# Patient Record
Sex: Female | Born: 1975 | Race: Black or African American | Hispanic: No | Smoking: Never smoker
Health system: Southern US, Community
[De-identification: ages and names within clinical notes are randomized; demographics above are authoritative.]

## PROBLEM LIST (undated history)

## (undated) DIAGNOSIS — E039 Hypothyroidism, unspecified: Secondary | ICD-10-CM

## (undated) DIAGNOSIS — F419 Anxiety disorder, unspecified: Secondary | ICD-10-CM

## (undated) DIAGNOSIS — F32A Depression, unspecified: Secondary | ICD-10-CM

## (undated) DIAGNOSIS — I10 Essential (primary) hypertension: Secondary | ICD-10-CM

## (undated) DIAGNOSIS — K219 Gastro-esophageal reflux disease without esophagitis: Secondary | ICD-10-CM

## (undated) DIAGNOSIS — N809 Endometriosis, unspecified: Secondary | ICD-10-CM

## (undated) DIAGNOSIS — Z973 Presence of spectacles and contact lenses: Secondary | ICD-10-CM

## (undated) DIAGNOSIS — F329 Major depressive disorder, single episode, unspecified: Secondary | ICD-10-CM

## (undated) DIAGNOSIS — Z8489 Family history of other specified conditions: Secondary | ICD-10-CM

## (undated) DIAGNOSIS — J45909 Unspecified asthma, uncomplicated: Secondary | ICD-10-CM

## (undated) DIAGNOSIS — R0602 Shortness of breath: Secondary | ICD-10-CM

## (undated) DIAGNOSIS — R51 Headache: Secondary | ICD-10-CM

## (undated) DIAGNOSIS — D649 Anemia, unspecified: Secondary | ICD-10-CM

## (undated) HISTORY — PX: DIAGNOSTIC LAPAROSCOPY: SUR761

## (undated) HISTORY — DX: Endometriosis, unspecified: N80.9

## (undated) HISTORY — DX: Morbid (severe) obesity due to excess calories: E66.01

---

## 2006-10-10 HISTORY — PX: DIAGNOSTIC LAPAROSCOPY: SUR761

## 2013-05-01 ENCOUNTER — Other Ambulatory Visit: Payer: Self-pay | Admitting: Obstetrics and Gynecology

## 2013-05-01 ENCOUNTER — Encounter (HOSPITAL_COMMUNITY): Payer: Self-pay | Admitting: Obstetrics and Gynecology

## 2013-05-02 ENCOUNTER — Encounter (HOSPITAL_COMMUNITY): Payer: Self-pay

## 2013-05-03 ENCOUNTER — Ambulatory Visit (HOSPITAL_COMMUNITY): Payer: BC Managed Care – PPO | Admitting: Anesthesiology

## 2013-05-03 ENCOUNTER — Encounter (HOSPITAL_COMMUNITY)
Admission: RE | Disposition: A | Discharge status: Home / Self Care | Payer: Self-pay | Source: Ambulatory Visit | Attending: Obstetrics and Gynecology

## 2013-05-03 ENCOUNTER — Encounter (HOSPITAL_COMMUNITY): Payer: Self-pay | Admitting: Anesthesiology

## 2013-05-03 ENCOUNTER — Ambulatory Visit (HOSPITAL_COMMUNITY)
Admission: RE | Admit: 2013-05-03 | Discharge: 2013-05-03 | Disposition: A | Discharge status: Home / Self Care | Payer: BC Managed Care – PPO | Source: Ambulatory Visit | Attending: Obstetrics and Gynecology | Admitting: Obstetrics and Gynecology

## 2013-05-03 DIAGNOSIS — N938 Other specified abnormal uterine and vaginal bleeding: Secondary | ICD-10-CM | POA: Insufficient documentation

## 2013-05-03 DIAGNOSIS — I10 Essential (primary) hypertension: Secondary | ICD-10-CM | POA: Insufficient documentation

## 2013-05-03 DIAGNOSIS — N949 Unspecified condition associated with female genital organs and menstrual cycle: Secondary | ICD-10-CM | POA: Insufficient documentation

## 2013-05-03 DIAGNOSIS — N840 Polyp of corpus uteri: Secondary | ICD-10-CM | POA: Insufficient documentation

## 2013-05-03 HISTORY — DX: Essential (primary) hypertension: I10

## 2013-05-03 HISTORY — DX: Hypothyroidism, unspecified: E03.9

## 2013-05-03 HISTORY — DX: Anxiety disorder, unspecified: F41.9

## 2013-05-03 HISTORY — DX: Depression, unspecified: F32.A

## 2013-05-03 HISTORY — DX: Shortness of breath: R06.02

## 2013-05-03 HISTORY — DX: Headache: R51

## 2013-05-03 HISTORY — DX: Major depressive disorder, single episode, unspecified: F32.9

## 2013-05-03 HISTORY — DX: Unspecified asthma, uncomplicated: J45.909

## 2013-05-03 HISTORY — DX: Gastro-esophageal reflux disease without esophagitis: K21.9

## 2013-05-03 HISTORY — PX: DILATATION & CURRETTAGE/HYSTEROSCOPY WITH RESECTOCOPE: SHX5572

## 2013-05-03 LAB — CBC
HCT: 33.6 % — ABNORMAL LOW (ref 36.0–46.0)
Hemoglobin: 11.1 g/dL — ABNORMAL LOW (ref 12.0–15.0)
MCH: 25.5 pg — ABNORMAL LOW (ref 26.0–34.0)
MCHC: 33 g/dL (ref 30.0–36.0)
MCV: 77.1 fL — ABNORMAL LOW (ref 78.0–100.0)

## 2013-05-03 SURGERY — DILATATION & CURETTAGE/HYSTEROSCOPY WITH RESECTOCOPE
Anesthesia: General | Site: Uterus | Wound class: Clean Contaminated

## 2013-05-03 MED ORDER — GLYCOPYRROLATE 0.2 MG/ML IJ SOLN
INTRAMUSCULAR | Status: DC | PRN
  Administered 2013-05-03: 0.2 mg via INTRAVENOUS

## 2013-05-03 MED ORDER — DEXAMETHASONE SODIUM PHOSPHATE 10 MG/ML IJ SOLN
INTRAMUSCULAR | Status: DC | PRN
  Administered 2013-05-03: 10 mg via INTRAVENOUS

## 2013-05-03 MED ORDER — IBUPROFEN 800 MG PO TABS: 800.0000 mg | ORAL_TABLET | Freq: Three times a day (TID) | ORAL | Status: DC | PRN

## 2013-05-03 MED ORDER — KETOROLAC TROMETHAMINE 30 MG/ML IJ SOLN
INTRAMUSCULAR | Status: DC | PRN
  Administered 2013-05-03: 30 mg via INTRAVENOUS
  Administered 2013-05-03: 30 mg via INTRAMUSCULAR

## 2013-05-03 MED ORDER — PROPOFOL 10 MG/ML IV EMUL
INTRAVENOUS | Status: AC
  Filled 2013-05-03: qty 20

## 2013-05-03 MED ORDER — MEPERIDINE HCL 25 MG/ML IJ SOLN: 6.2500 mg | INTRAMUSCULAR | Status: DC | PRN

## 2013-05-03 MED ORDER — ONDANSETRON HCL 4 MG/2ML IJ SOLN
INTRAMUSCULAR | Status: DC | PRN
  Administered 2013-05-03: 4 mg via INTRAVENOUS

## 2013-05-03 MED ORDER — FENTANYL CITRATE 0.05 MG/ML IJ SOLN
INTRAMUSCULAR | Status: AC
  Filled 2013-05-03: qty 2

## 2013-05-03 MED ORDER — ONDANSETRON HCL 4 MG/2ML IJ SOLN: 4.0000 mg | Freq: Once | INTRAMUSCULAR | Status: DC | PRN

## 2013-05-03 MED ORDER — CHLOROPROCAINE HCL 1 % IJ SOLN
INTRAMUSCULAR | Status: DC | PRN
  Administered 2013-05-03: 18 mL

## 2013-05-03 MED ORDER — MIDAZOLAM HCL 2 MG/2ML IJ SOLN
INTRAMUSCULAR | Status: AC
  Filled 2013-05-03: qty 2

## 2013-05-03 MED ORDER — LIDOCAINE HCL (CARDIAC) 20 MG/ML IV SOLN
INTRAVENOUS | Status: AC
  Filled 2013-05-03: qty 5

## 2013-05-03 MED ORDER — ONDANSETRON HCL 4 MG/2ML IJ SOLN
INTRAMUSCULAR | Status: AC
  Filled 2013-05-03: qty 2

## 2013-05-03 MED ORDER — GLYCINE 1.5 % IR SOLN
Status: DC | PRN
  Administered 2013-05-03: 3000 mL

## 2013-05-03 MED ORDER — PROPOFOL 10 MG/ML IV BOLUS
INTRAVENOUS | Status: DC | PRN
  Administered 2013-05-03: 250 mg via INTRAVENOUS

## 2013-05-03 MED ORDER — FENTANYL CITRATE 0.05 MG/ML IJ SOLN: 25.0000 ug | INTRAMUSCULAR | Status: DC | PRN

## 2013-05-03 MED ORDER — KETOROLAC TROMETHAMINE 30 MG/ML IJ SOLN: 15.0000 mg | Freq: Once | INTRAMUSCULAR | Status: DC | PRN

## 2013-05-03 MED ORDER — KETOROLAC TROMETHAMINE 30 MG/ML IJ SOLN
INTRAMUSCULAR | Status: AC
  Filled 2013-05-03: qty 1

## 2013-05-03 MED ORDER — KETOROLAC TROMETHAMINE 30 MG/ML IJ SOLN
INTRAMUSCULAR | Status: AC
  Filled 2013-05-03: qty 2

## 2013-05-03 MED ORDER — LIDOCAINE HCL (CARDIAC) 20 MG/ML IV SOLN
INTRAVENOUS | Status: DC | PRN
  Administered 2013-05-03: 10 mg via INTRAVENOUS

## 2013-05-03 MED ORDER — CHLOROPROCAINE HCL 1 % IJ SOLN
INTRAMUSCULAR | Status: AC
  Filled 2013-05-03: qty 30

## 2013-05-03 MED ORDER — MIDAZOLAM HCL 5 MG/5ML IJ SOLN
INTRAMUSCULAR | Status: DC | PRN
  Administered 2013-05-03: 2 mg via INTRAVENOUS

## 2013-05-03 MED ORDER — LACTATED RINGERS IV SOLN
INTRAVENOUS | Status: DC
  Administered 2013-05-03 (×2): via INTRAVENOUS

## 2013-05-03 MED ORDER — DEXAMETHASONE SODIUM PHOSPHATE 10 MG/ML IJ SOLN
INTRAMUSCULAR | Status: AC
  Filled 2013-05-03: qty 1

## 2013-05-03 MED ORDER — FENTANYL CITRATE 0.05 MG/ML IJ SOLN
INTRAMUSCULAR | Status: DC | PRN
  Administered 2013-05-03: 25 ug via INTRAVENOUS
  Administered 2013-05-03 (×2): 50 ug via INTRAVENOUS
  Administered 2013-05-03: 25 ug via INTRAVENOUS

## 2013-05-03 SURGICAL SUPPLY — 17 items
CANISTER SUCTION 2500CC (MISCELLANEOUS) ×6 IMPLANT
CATH ROBINSON RED A/P 16FR (CATHETERS) ×2 IMPLANT
CLOTH BEACON ORANGE TIMEOUT ST (SAFETY) ×2 IMPLANT
CONTAINER PREFILL 10% NBF 60ML (FORM) ×4 IMPLANT
DRESSING TELFA 8X3 (GAUZE/BANDAGES/DRESSINGS) ×2 IMPLANT
ELECT REM PT RETURN 9FT ADLT (ELECTROSURGICAL) ×2
ELECTRODE REM PT RTRN 9FT ADLT (ELECTROSURGICAL) ×1 IMPLANT
GLOVE BIO SURGEON STRL SZ 6.5 (GLOVE) ×4 IMPLANT
GLOVE BIOGEL PI IND STRL 7.0 (GLOVE) ×6 IMPLANT
GLOVE BIOGEL PI INDICATOR 7.0 (GLOVE) ×6
GLOVE SURG SS PI 7.0 STRL IVOR (GLOVE) ×10 IMPLANT
GOWN STRL REIN XL XLG (GOWN DISPOSABLE) ×6 IMPLANT
LOOP ANGLED CUTTING 22FR (CUTTING LOOP) ×2 IMPLANT
PACK HYSTEROSCOPY LF (CUSTOM PROCEDURE TRAY) ×2 IMPLANT
PAD OB MATERNITY 4.3X12.25 (PERSONAL CARE ITEMS) ×2 IMPLANT
TOWEL OR 17X24 6PK STRL BLUE (TOWEL DISPOSABLE) ×4 IMPLANT
WATER STERILE IRR 1000ML POUR (IV SOLUTION) ×2 IMPLANT

## 2013-05-03 NOTE — H&P (Signed)
  Admit  CC: irreg bleeding  HPI 37 yo G1P1 BF presents for dx hysteroscopy, D&C, endom polyp here for surgical mgmt. Pt had sonohysterogram which showed multiple endom mass   All> NKDA Med: amlodipine Chlorthalidone  Medical: chronic HTN Surgery  FH noncontributory SH: nonsmoker ROS neg  PE: WDWN F in NAD Skin: no lesion HEENT anicteric sclera pink conjunctiva Oropharynx CoR: RRR Lung: clear to A Abd obese, nontender  Pelvic deferred Extr no edema  IMP: DUB endom polyp P) dx hysteroscopy, D&C. Risk of surgery: infection, bleeding, injury to surrounding organ structures, thermal injury

## 2013-05-03 NOTE — Op Note (Signed)
Penny Yu, Penny Yu                  ACCOUNT NO.:  [id number]  MEDICAL RECORD NO.:  [id number]  LOCATION:  [facility]                          FACILITY:  [facility]  PHYSICIAN:  Maxie Better, M.D.DATE OF BIRTH:  1976-06-22  DATE OF PROCEDURE:  05/03/2013 DATE OF DISCHARGE:  05/03/2013                              OPERATIVE REPORT   PREOPERATIVE DIAGNOSES: 1. Dysfunction uterine bleeding. 2. Endometrial mass.  PROCEDURES:  Diagnostic hysteroscopy, dilation and curettage.  POSTOPERATIVE DIAGNOSES: 1. Dysfunction uterine bleeding. 2. Endometrial mass.  ANESTHESIA:  General, paracervical block.  SURGEON:  Maxie Better, MD  ASSISTANT:  None.  DESCRIPTION OF PROCEDURE:  Under adequate general anesthesia, the patient was placed in the dorsal lithotomy position.  She was sterilely prepped and draped in usual fashion.  Bladder was catheterized for moderate amount of urine.  Examination under anesthesia revealed a retroverted uterus, slightly enlarged.  No adnexal masses could be appreciated.  Bivalve speculum was placed in the vagina.  Parous cervix was noted.  Wide ectropion seen.  A 20 mL of 1% Nesacaine was injected paracervically at the 3 and 9 o'clock position.  The anterior lip of the cervix was grasped with a single-tooth tenaculum.  The cervix was easily dilated up to #27 [facility] dilator.  A glycine-primed resectoscope was introduced into the uterine cavity.  Large amount of tissue admixed with blood was noted.  The resectoscope was removed.  The cavity was then sharply curetted.  The resectoscope was inserted.  The right tubal ostia could be seen.  The left was not seen clearly.  There was no evidence of fibroid, although when the cavity has been curetted, the right posterior aspect felt a little bit irregular.  Nonetheless, inspection of the anterior and posterior including the endocervix showed no evidence of any lesions, at which time the resectoscope was removed.  All  instruments were then removed from the vagina.  SPECIMEN:  Labeled endometrial curetting with polyps sent to Pathology.  ESTIMATED BLOOD LOSS:  Minimal.  FLUID DEFICIT:  220 mL.  COMPLICATIONS:  None.  The patient tolerated the procedure well, was transferred to the recovery room in stable condition.     Maxie Better, M.D.     [place]/[facility]  D:  05/03/2013  T:  05/03/2013  Job:  [id number]

## 2013-05-03 NOTE — Anesthesia Procedure Notes (Signed)
Procedure Name: LMA Insertion Date/Time: 05/03/2013 2:18 PM Performed by: Lincoln Brigham Pre-anesthesia Checklist: Patient identified, Patient being monitored, Emergency Drugs available, Suction available and Timeout performed Patient Re-evaluated:Patient Re-evaluated prior to inductionOxygen Delivery Method: Circle system utilized Preoxygenation: Pre-oxygenation with 100% oxygen Intubation Type: IV induction Ventilation: Mask ventilation without difficulty LMA: LMA inserted LMA Size: 4.0 Tube size: 4.0 mm Number of attempts: 1 Placement Confirmation: ETT inserted through vocal cords under direct vision,  positive ETCO2,  CO2 detector and breath sounds checked- equal and bilateral

## 2013-05-03 NOTE — Anesthesia Postprocedure Evaluation (Signed)
Anesthesia Post Note  Patient: Penny Yu  Procedure(s) Performed: Procedure(s) (LRB): DILATATION & CURETTAGE/HYSTEROSCOPY WITH RESECTOCOPE (N/A)  Anesthesia type: General  Patient location: PACU  Post pain: Pain level controlled  Post assessment: Post-op Vital signs reviewed  Last Vitals:  Filed Vitals:   05/03/13 1545  BP: 135/67  Pulse: 62  Temp:   Resp: 19    Post vital signs: Reviewed  Level of consciousness: sedated  Complications: No apparent anesthesia complications

## 2013-05-03 NOTE — Anesthesia Preprocedure Evaluation (Signed)
Anesthesia Evaluation  Patient identified by MRN, date of birth, ID band Patient awake    Reviewed: Allergy & Precautions, H&P , NPO status , Patient's Chart, lab work & pertinent test results  Airway Mallampati: I TM Distance: >3 FB Neck ROM: full    Dental no notable dental hx. (+) Teeth Intact   Pulmonary neg pulmonary ROS,    Pulmonary exam normal       Cardiovascular hypertension, Pt. on medications     Neuro/Psych negative neurological ROS  negative psych ROS   GI/Hepatic negative GI ROS, Neg liver ROS,   Endo/Other  negative endocrine ROS  Renal/GU negative Renal ROS  negative genitourinary   Musculoskeletal negative musculoskeletal ROS (+)   Abdominal Normal abdominal exam  (+)   Peds negative pediatric ROS (+)  Hematology negative hematology ROS (+)   Anesthesia Other Findings   Reproductive/Obstetrics negative OB ROS                           Anesthesia Physical Anesthesia Plan  ASA: II  Anesthesia Plan: General   Post-op Pain Management:    Induction: Intravenous  Airway Management Planned: LMA  Additional Equipment:   Intra-op Plan:   Post-operative Plan:   Informed Consent: I have reviewed the patients History and Physical, chart, labs and discussed the procedure including the risks, benefits and alternatives for the proposed anesthesia with the patient or authorized representative who has indicated his/her understanding and acceptance.     Plan Discussed with: CRNA and Surgeon  Anesthesia Plan Comments:         Anesthesia Quick Evaluation

## 2013-05-03 NOTE — Transfer of Care (Signed)
Immediate Anesthesia Transfer of Care Note  Patient: Penny Yu  Procedure(s) Performed: Procedure(s): DILATATION & CURETTAGE/HYSTEROSCOPY WITH RESECTOCOPE (N/A)  Patient Location: PACU  Anesthesia Type:General  Level of Consciousness: awake, alert  and oriented  Airway & Oxygen Therapy: Patient Spontanous Breathing and Patient connected to nasal cannula oxygen  Post-op Assessment: Report given to PACU RN and Post -op Vital signs reviewed and stable  Post vital signs: Reviewed and stable  Complications: No apparent anesthesia complications

## 2013-05-06 ENCOUNTER — Encounter (HOSPITAL_COMMUNITY): Payer: Self-pay | Admitting: Obstetrics and Gynecology

## 2014-08-01 ENCOUNTER — Emergency Department (HOSPITAL_COMMUNITY): Payer: BC Managed Care – PPO

## 2014-08-01 ENCOUNTER — Emergency Department (HOSPITAL_COMMUNITY)
Admission: EM | Admit: 2014-08-01 | Discharge: 2014-08-01 | Disposition: A | Discharge status: Home / Self Care | Payer: BC Managed Care – PPO | Attending: Emergency Medicine | Admitting: Emergency Medicine

## 2014-08-01 ENCOUNTER — Encounter (HOSPITAL_COMMUNITY): Payer: Self-pay | Admitting: Emergency Medicine

## 2014-08-01 DIAGNOSIS — Z8719 Personal history of other diseases of the digestive system: Secondary | ICD-10-CM | POA: Insufficient documentation

## 2014-08-01 DIAGNOSIS — Z79899 Other long term (current) drug therapy: Secondary | ICD-10-CM | POA: Insufficient documentation

## 2014-08-01 DIAGNOSIS — Z8659 Personal history of other mental and behavioral disorders: Secondary | ICD-10-CM | POA: Insufficient documentation

## 2014-08-01 DIAGNOSIS — Z8639 Personal history of other endocrine, nutritional and metabolic disease: Secondary | ICD-10-CM | POA: Insufficient documentation

## 2014-08-01 DIAGNOSIS — I10 Essential (primary) hypertension: Secondary | ICD-10-CM | POA: Diagnosis not present

## 2014-08-01 DIAGNOSIS — R079 Chest pain, unspecified: Secondary | ICD-10-CM

## 2014-08-01 DIAGNOSIS — J45901 Unspecified asthma with (acute) exacerbation: Secondary | ICD-10-CM | POA: Insufficient documentation

## 2014-08-01 DIAGNOSIS — Z720 Tobacco use: Secondary | ICD-10-CM | POA: Diagnosis not present

## 2014-08-01 LAB — BASIC METABOLIC PANEL
ANION GAP: 12 (ref 5–15)
BUN: 12 mg/dL (ref 6–23)
CHLORIDE: 100 meq/L (ref 96–112)
CO2: 24 meq/L (ref 19–32)
CREATININE: 0.65 mg/dL (ref 0.50–1.10)
Calcium: 8.9 mg/dL (ref 8.4–10.5)
GFR calc non Af Amer: >90 mL/min (ref >90)
Glucose, Bld: 93 mg/dL (ref 70–99)
POTASSIUM: 3.9 meq/L (ref 3.7–5.3)
Sodium: 136 mEq/L — ABNORMAL LOW (ref 137–147)

## 2014-08-01 LAB — CBC
HEMATOCRIT: 36.3 % (ref 36.0–46.0)
Hemoglobin: 11.8 g/dL — ABNORMAL LOW (ref 12.0–15.0)
MCH: 24.8 pg — ABNORMAL LOW (ref 26.0–34.0)
MCHC: 32.5 g/dL (ref 30.0–36.0)
MCV: 76.3 fL — AB (ref 78.0–100.0)
PLATELETS: 375 10*3/uL (ref 150–400)
RBC: 4.76 MIL/uL (ref 3.87–5.11)
RDW: 14.7 % (ref 11.5–15.5)
WBC: 4.4 10*3/uL (ref 4.0–10.5)

## 2014-08-01 LAB — I-STAT TROPONIN, ED: Troponin i, poc: 0 ng/mL (ref 0.00–0.08)

## 2014-08-01 MED ORDER — KETOROLAC TROMETHAMINE 60 MG/2ML IM SOLN
60.0000 mg | Freq: Once | INTRAMUSCULAR | Status: AC
  Administered 2014-08-01: 60 mg via INTRAMUSCULAR
  Filled 2014-08-01: qty 2

## 2014-08-01 MED ORDER — NAPROXEN 500 MG PO TABS: 500.0000 mg | ORAL_TABLET | Freq: Two times a day (BID) | ORAL | Status: DC

## 2014-08-01 NOTE — Discharge Instructions (Signed)

## 2014-08-01 NOTE — ED Provider Notes (Signed)
CSN: [id number]     Arrival date & time 08/01/14  7035 History   First MD Initiated Contact with Patient 08/01/14 [phone]     Chief Complaint  Patient presents with  . Chest Pain     (Consider location/radiation/quality/duration/timing/severity/associated sxs/prior Treatment) Patient is a 38 y.o. female presenting with chest pain. The history is provided by the patient. No language interpreter was used.  Chest Pain Pain location:  Substernal area Pain quality: tightness   Pain radiates to:  Does not radiate Pain severity:  Moderate Onset quality:  Unable to specify Duration:  1 week Timing:  Constant Progression:  Unchanged Chronicity:  New Context: at rest   Relieved by:  Nothing Exacerbated by: Lifting arms above head, coughing, bending forward. Ineffective treatments: Gas-X. Associated symptoms: shortness of breath (on exertion only)   Associated symptoms: no abdominal pain, no back pain, no claudication, no cough, no diaphoresis, no fatigue, no fever, no headache, no lower extremity edema, no nausea, no near-syncope, no numbness, no palpitations, not vomiting and no weakness   Risk factors: hypertension   Risk factors: no aortic disease, no birth control, no coronary artery disease, no diabetes mellitus, no high cholesterol, no immobilization, not female, not obese, no prior DVT/PE, no smoking and no surgery     Past Medical History  Diagnosis Date  . Hypertension     with current pregnancy  . Anxiety   . Depression   . Asthma   . Shortness of breath   . GERD (gastroesophageal reflux disease)   . Headache(784.0)   . Hypothyroidism    Past Surgical History  Procedure Laterality Date  . Diagnostic laparoscopy    . Cesarean section  2006, 2007  . Dilatation & currettage/hysteroscopy with resectocope N/A 05/03/2013    Procedure: DILATATION & CURETTAGE/HYSTEROSCOPY WITH RESECTOCOPE;  Surgeon: Marvene Staff, MD;  Location: [facility];  Service: Gynecology;  Laterality:  N/A;   No family history on file. History  Substance Use Topics  . Smoking status: Light Tobacco Smoker -- 0.25 packs/day for 7 years    Types: Cigarettes  . Smokeless tobacco: Never Used  . Alcohol Use: No   OB History   Grav Para Term Preterm Abortions TAB SAB Ect Mult Living                 Review of Systems  Constitutional: Negative for fever, chills, diaphoresis, activity change, appetite change and fatigue.  HENT: Negative for congestion, facial swelling, rhinorrhea and sore throat.   Eyes: Negative for photophobia and discharge.  Respiratory: Positive for shortness of breath (on exertion only). Negative for cough and chest tightness.   Cardiovascular: Positive for chest pain. Negative for palpitations, claudication, leg swelling and near-syncope.  Gastrointestinal: Negative for nausea, vomiting, abdominal pain and diarrhea.  Endocrine: Negative for polydipsia and polyuria.  Genitourinary: Negative for dysuria, frequency, difficulty urinating and pelvic pain.  Musculoskeletal: Negative for arthralgias, back pain, neck pain and neck stiffness.  Skin: Negative for color change and wound.  Allergic/Immunologic: Negative for immunocompromised state.  Neurological: Negative for facial asymmetry, weakness, numbness and headaches.  Hematological: Does not bruise/bleed easily.  Psychiatric/Behavioral: Negative for confusion and agitation.      Allergies  Review of patient's allergies indicates no known allergies.  Home Medications   Prior to Admission medications   Medication Sig Start Date End Date Taking? Authorizing Provider  amLODipine (NORVASC) 2.5 MG tablet Take 2.5 mg by mouth at bedtime.    Historical Provider, MD  chlorthalidone (  HYGROTON) 25 MG tablet Take 25 mg by mouth at bedtime.    Historical Provider, MD  ibuprofen (ADVIL,MOTRIN) 800 MG tablet Take 1 tablet (800 mg total) by mouth every 8 (eight) hours as needed for pain. 05/03/13   Sheronette A Cousins, MD    BP 154/88  Pulse 70  Resp 19  SpO2 100%  LMP 07/02/2014 Physical Exam  Constitutional: She is oriented to person, place, and time. She appears well-developed and well-nourished. No distress.  HENT:  Head: Normocephalic and atraumatic.  Mouth/Throat: No oropharyngeal exudate.  Eyes: Pupils are equal, round, and reactive to light.  Neck: Normal range of motion. Neck supple.  Cardiovascular: Normal rate, regular rhythm and normal heart sounds.  Exam reveals no gallop and no friction rub.   No murmur heard. Pulmonary/Chest: Effort normal and breath sounds normal. No respiratory distress. She has no wheezes. She has no rales.  Abdominal: Soft. Bowel sounds are normal. She exhibits no distension and no mass. There is no tenderness. There is no rebound and no guarding.  Musculoskeletal: Normal range of motion. She exhibits no edema and no tenderness.  Neurological: She is alert and oriented to person, place, and time.  Skin: Skin is warm and dry.  Psychiatric: She has a normal mood and affect.    ED Course  Procedures (including critical care time) Labs Review Labs Reviewed  CBC - Abnormal; Notable for the following:    Hemoglobin 11.8 (*)    MCV 76.3 (*)    MCH 24.8 (*)    All other components within normal limits  BASIC METABOLIC PANEL  I-STAT TROPOININ, ED    Imaging Review No results found.   EKG Interpretation None       Date: 08/01/2014  Rate: 80  Rhythm: normal sinus rhythm  QRS Axis: normal  Intervals: normal  ST/T Wave abnormalities: normal  Conduction Disutrbances:none  Narrative Interpretation:   Old EKG Reviewed: none available    MDM   Final diagnoses:  Chest pain    Pt is a 38 y.o. female with Pmhx as above who presents with approximately one week of constant midsternal chest pressure without radiation. She's had mild shortness of breath with exertion. She denies fever, chills, cough. Pain is exacerbated by bending over and raise her arms above  her head, as well as deep breathing. Was not relieved by over-the-counter Gas-X. TIM score is 0. PERC negative. EKG unremarkable. Chest x-ray normal. Troponin is negative which is reassuring given her one week of constant pain. She is low risk for MACE using HEART score. I doubt cardiogenic pain until this is more likely to be musculoskeletal. We'll discharge him with instructions for her scheduled naproxen use. Return precautions given for new worsening symptoms including short of breath, fever, leg pain or swelling. She can otherwise followup with her PCP. She is encouraged to take her amlodipine daily.    Ernestina Patches, MD 08/01/14 [phone]

## 2014-08-01 NOTE — ED Notes (Signed)
Pt on monitor but d/t technical problems will not allow EKG to be obtained; will not let pt be admitted. NT at bedside obtaining portable EKG. Pt in NAD

## 2014-08-01 NOTE — ED Notes (Signed)
Per pt, states chest pain for about a week, does not radiate-occasional dizziness-no N/V-noncompliant with HTN Meds

## 2015-08-13 ENCOUNTER — Encounter (HOSPITAL_COMMUNITY): Payer: Self-pay | Admitting: Emergency Medicine

## 2015-08-13 ENCOUNTER — Emergency Department (HOSPITAL_COMMUNITY)
Admission: EM | Admit: 2015-08-13 | Discharge: 2015-08-13 | Disposition: A | Discharge status: Home / Self Care | Payer: BLUE CROSS/BLUE SHIELD | Attending: Emergency Medicine | Admitting: Emergency Medicine

## 2015-08-13 DIAGNOSIS — I10 Essential (primary) hypertension: Secondary | ICD-10-CM | POA: Diagnosis not present

## 2015-08-13 DIAGNOSIS — M76891 Other specified enthesopathies of right lower limb, excluding foot: Secondary | ICD-10-CM

## 2015-08-13 DIAGNOSIS — Z720 Tobacco use: Secondary | ICD-10-CM | POA: Insufficient documentation

## 2015-08-13 DIAGNOSIS — Z791 Long term (current) use of non-steroidal anti-inflammatories (NSAID): Secondary | ICD-10-CM | POA: Insufficient documentation

## 2015-08-13 DIAGNOSIS — Z8639 Personal history of other endocrine, nutritional and metabolic disease: Secondary | ICD-10-CM | POA: Insufficient documentation

## 2015-08-13 DIAGNOSIS — Z8719 Personal history of other diseases of the digestive system: Secondary | ICD-10-CM | POA: Insufficient documentation

## 2015-08-13 DIAGNOSIS — M7651 Patellar tendinitis, right knee: Secondary | ICD-10-CM | POA: Diagnosis not present

## 2015-08-13 DIAGNOSIS — Z79899 Other long term (current) drug therapy: Secondary | ICD-10-CM | POA: Insufficient documentation

## 2015-08-13 DIAGNOSIS — M25561 Pain in right knee: Secondary | ICD-10-CM | POA: Diagnosis present

## 2015-08-13 DIAGNOSIS — Z8659 Personal history of other mental and behavioral disorders: Secondary | ICD-10-CM | POA: Insufficient documentation

## 2015-08-13 DIAGNOSIS — J45909 Unspecified asthma, uncomplicated: Secondary | ICD-10-CM | POA: Diagnosis not present

## 2015-08-13 DIAGNOSIS — M79604 Pain in right leg: Secondary | ICD-10-CM

## 2015-08-13 MED ORDER — IBUPROFEN 600 MG PO TABS: 600.0000 mg | ORAL_TABLET | Freq: Four times a day (QID) | ORAL | Status: DC | PRN

## 2015-08-13 NOTE — ED Notes (Signed)
Bed: WTR6 Expected date:  Expected time:  Means of arrival:  Comments: 

## 2015-08-13 NOTE — ED Provider Notes (Signed)
CSN: [id number]     Arrival date & time 08/13/15  4536 History   First MD Initiated Contact with Patient 08/13/15 1004     Chief Complaint  Patient presents with  . Leg Pain     (Consider location/radiation/quality/duration/timing/severity/associated sxs/prior Treatment) HPI Comments: Patient presents to the ED with a chief complaint of right knee pain.  She states that she has been having pain along the lateral side of her right knee for the past week.  She denies any known mechanism of injury. She states that her symptoms are worsened with palpation and are worsened after she has been sitting still for a period of time. She denies any fevers, chills, chest pain, shortness of breath. Denies any history of DVT or PE. Denies any recent travel or immobilization. She denies any calf pain.  The history is provided by the patient. No language interpreter was used.    Past Medical History  Diagnosis Date  . Hypertension     with current pregnancy  . Anxiety   . Depression   . Asthma   . Shortness of breath   . GERD (gastroesophageal reflux disease)   . Headache(784.0)   . Hypothyroidism    Past Surgical History  Procedure Laterality Date  . Diagnostic laparoscopy    . Cesarean section  2006, 2007  . Dilatation & currettage/hysteroscopy with resectocope N/A 05/03/2013    Procedure: DILATATION & CURETTAGE/HYSTEROSCOPY WITH RESECTOCOPE;  Surgeon: Marvene Staff, MD;  Location: [facility];  Service: Gynecology;  Laterality: N/A;   No family history on file. Social History  Substance Use Topics  . Smoking status: Light Tobacco Smoker -- 0.25 packs/day for 7 years    Types: Cigarettes  . Smokeless tobacco: Never Used  . Alcohol Use: No   OB History    No data available     Review of Systems  Constitutional: Negative for fever and chills.  Respiratory: Negative for shortness of breath.   Cardiovascular: Negative for chest pain.  Gastrointestinal: Negative for nausea, vomiting,  diarrhea and constipation.  Genitourinary: Negative for dysuria.  Musculoskeletal: Positive for myalgias and arthralgias.  All other systems reviewed and are negative.     Allergies  Review of patient's allergies indicates no known allergies.  Home Medications   Prior to Admission medications   Medication Sig Start Date End Date Taking? Authorizing Provider  amLODipine (NORVASC) 2.5 MG tablet Take 2.5 mg by mouth daily with breakfast.     Historical Provider, MD  Multiple Vitamin (MULTIVITAMIN WITH MINERALS) TABS tablet Take 1 tablet by mouth daily.    Historical Provider, MD  naproxen (NAPROSYN) 500 MG tablet Take 1 tablet (500 mg total) by mouth 2 (two) times daily with a meal. 08/01/14   Ernestina Patches, MD   BP 142/80 mmHg  Pulse 78  Temp(Src) 97.7 F (36.5 C) (Oral)  SpO2 99% Physical Exam  Constitutional: She is oriented to person, place, and time. She appears well-developed and well-nourished.  HENT:  Head: Normocephalic and atraumatic.  Eyes: Conjunctivae and EOM are normal.  Neck: Normal range of motion.  Cardiovascular: Normal rate.   Pulmonary/Chest: Effort normal.  Abdominal: She exhibits no distension.  Musculoskeletal: Normal range of motion.  Right knee tender to palpation over the lateral aspect/lateral joint line, no obvious bony abnormality or deformity, no calf tenderness, no leg swelling, no evidence of DVT  Neurological: She is alert and oriented to person, place, and time.  Skin: Skin is dry.  No cellulitis or abscess  Psychiatric: She has a normal mood and affect. Her behavior is normal. Judgment and thought content normal.  Nursing note and vitals reviewed.   ED Course  Procedures (including critical care time)   MDM   Final diagnoses:  Pain of right lower extremity  Tendonitis of knee, right   Patient with lateral right knee pain. Could be tendinitis, possibly meniscal injury. Will give knee support sleeve/Ace wrap, recommend ice and NSAIDs.  Recommend orthopedic follow-up in 1 week if symptoms are no better. No evidence of DVT. No evidence of cellulitis or abscess. Patient is ambulatory, no mechanism of injury.    Montine Circle, PA-C 08/13/15 [place], MD 08/13/15 [phone]

## 2015-08-13 NOTE — Discharge Instructions (Signed)
Musculoskeletal Pain Musculoskeletal pain is muscle and boney aches and pains. These pains can occur in any part of the body. Your caregiver may treat you without knowing the cause of the pain. They may treat you if blood or urine tests, X-rays, and other tests were normal.  CAUSES There is often not a definite cause or reason for these pains. These pains may be caused by a type of germ (virus). The discomfort may also come from overuse. Overuse includes working out too hard when your body is not fit. Boney aches also come from weather changes. Bone is sensitive to atmospheric pressure changes. HOME CARE INSTRUCTIONS   Ask when your test results will be ready. Make sure you get your test results.  Only take over-the-counter or prescription medicines for pain, discomfort, or fever as directed by your caregiver. If you were given medications for your condition, do not drive, operate machinery or power tools, or sign legal documents for 24 hours. Do not drink alcohol. Do not take sleeping pills or other medications that may interfere with treatment.  Continue all activities unless the activities cause more pain. When the pain lessens, slowly resume normal activities. Gradually increase the intensity and duration of the activities or exercise.  During periods of severe pain, bed rest may be helpful. Lay or sit in any position that is comfortable.  Putting ice on the injured area.  Put ice in a bag.  Place a towel between your skin and the bag.  Leave the ice on for 15 to 20 minutes, 3 to 4 times a day.  Follow up with your caregiver for continued problems and no reason can be found for the pain. If the pain becomes worse or does not go away, it may be necessary to repeat tests or do additional testing. Your caregiver may need to look further for a possible cause. SEEK IMMEDIATE MEDICAL CARE IF:  You have pain that is getting worse and is not relieved by medications.  You develop chest pain  that is associated with shortness or breath, sweating, feeling sick to your stomach (nauseous), or throw up (vomit).  Your pain becomes localized to the abdomen.  You develop any new symptoms that seem different or that concern you. MAKE SURE YOU:   Understand these instructions.  Will watch your condition.  Will get help right away if you are not doing well or get worse.   This information is not intended to replace advice given to you by your health care provider. Make sure you discuss any questions you have with your health care provider.   Document Released: 09/26/2005 Document Revised: 12/19/2011 Document Reviewed: 05/31/2013 Elsevier Interactive Patient Education 2016 Else[employer]  Generic Knee Exercises EXERCISES RANGE OF MOTION (ROM) AND STRETCHING EXERCISES These exercises may help you when beginning to rehabilitate your injury. Your symptoms may resolve with or without further involvement from your physician, physical therapist, or athletic trainer. While completing these exercises, remember:   Restoring tissue flexibility helps normal motion to return to the joints. This allows healthier, less painful movement and activity.  An effective stretch should be held for at least 30 seconds.  A stretch should never be painful. You should only feel a gentle lengthening or release in the stretched tissue. STRETCH - Knee Extension, Prone  Lie on your stomach on a firm surface, such as a bed or countertop. Place your right / left knee and leg just beyond the edge of the surface. You may wish to place  a towel under the far end of your right / left thigh for comfort.  Relax your leg muscles and allow gravity to straighten your knee. Your clinician may advise you to add an ankle weight if more resistance is helpful for you.  You should feel a stretch in the back of your right / left knee. Hold this position for __________ seconds. Repeat __________ times. Complete this stretch  __________ times per day. * Your physician, physical therapist, or athletic trainer may ask you to add ankle weight to enhance your stretch.  RANGE OF MOTION - Knee Flexion, Active  Lie on your back with both knees straight. (If this causes back discomfort, bend your opposite knee, placing your foot flat on the floor.)  Slowly slide your heel back toward your buttocks until you feel a gentle stretch in the front of your knee or thigh.  Hold for __________ seconds. Slowly slide your heel back to the starting position. Repeat __________ times. Complete this exercise __________ times per day.  STRETCH - Quadriceps, Prone   Lie on your stomach on a firm surface, such as a bed or padded floor.  Bend your right / left knee and grasp your ankle. If you are unable to reach your ankle or pant leg, use a belt around your foot to lengthen your reach.  Gently pull your heel toward your buttocks. Your knee should not slide out to the side. You should feel a stretch in the front of your thigh and/or knee.  Hold this position for __________ seconds. Repeat __________ times. Complete this stretch __________ times per day.  STRETCH - Hamstrings, Supine   Lie on your back. Loop a belt or towel over the ball of your right / left foot.  Straighten your right / left knee and slowly pull on the belt to raise your leg. Do not allow the right / left knee to bend. Keep your opposite leg flat on the floor.  Raise the leg until you feel a gentle stretch behind your right / left knee or thigh. Hold this position for __________ seconds. Repeat __________ times. Complete this stretch __________ times per day.  STRENGTHENING EXERCISES These exercises may help you when beginning to rehabilitate your injury. They may resolve your symptoms with or without further involvement from your physician, physical therapist, or athletic trainer. While completing these exercises, remember:   Muscles can gain both the endurance  and the strength needed for everyday activities through controlled exercises.  Complete these exercises as instructed by your physician, physical therapist, or athletic trainer. Progress the resistance and repetitions only as guided.  You may experience muscle soreness or fatigue, but the pain or discomfort you are trying to eliminate should never worsen during these exercises. If this pain does worsen, stop and make certain you are following the directions exactly. If the pain is still present after adjustments, discontinue the exercise until you can discuss the trouble with your clinician. STRENGTH - Quadriceps, Isometrics  Lie on your back with your right / left leg extended and your opposite knee bent.  Gradually tense the muscles in the front of your right / left thigh. You should see either your knee cap slide up toward your hip or increased dimpling just above the knee. This motion will push the back of the knee down toward the floor/mat/bed on which you are lying.  Hold the muscle as tight as you can without increasing your pain for __________ seconds.  Relax the muscles slowly and  completely in between each repetition. Repeat __________ times. Complete this exercise __________ times per day.  STRENGTH - Quadriceps, Short Arcs   Lie on your back. Place a __________ inch towel roll under your knee so that the knee slightly bends.  Raise only your lower leg by tightening the muscles in the front of your thigh. Do not allow your thigh to rise.  Hold this position for __________ seconds. Repeat __________ times. Complete this exercise __________ times per day.  OPTIONAL ANKLE WEIGHTS: Begin with ____________________, but DO NOT exceed ____________________. Increase in 1 pound/0.5 kilogram increments.  STRENGTH - Quadriceps, Straight Leg Raises  Quality counts! Watch for signs that the quadriceps muscle is working to insure you are strengthening the correct muscles and not "cheating" by  substituting with healthier muscles.  Lay on your back with your right / left leg extended and your opposite knee bent.  Tense the muscles in the front of your right / left thigh. You should see either your knee cap slide up or increased dimpling just above the knee. Your thigh may even quiver.  Tighten these muscles even more and raise your leg 4 to 6 inches off the floor. Hold for __________ seconds.  Keeping these muscles tense, lower your leg.  Relax the muscles slowly and completely in between each repetition. Repeat __________ times. Complete this exercise __________ times per day.  STRENGTH - Hamstring, Curls  Lay on your stomach with your legs extended. (If you lay on a bed, your feet may hang over the edge.)  Tighten the muscles in the back of your thigh to bend your right / left knee up to 90 degrees. Keep your hips flat on the bed/floor.  Hold this position for __________ seconds.  Slowly lower your leg back to the starting position. Repeat __________ times. Complete this exercise __________ times per day.  OPTIONAL ANKLE WEIGHTS: Begin with ____________________, but DO NOT exceed ____________________. Increase in 1 pound/0.5 kilogram increments.  STRENGTH - Quadriceps, Squats  Stand in a door frame so that your feet and knees are in line with the frame.  Use your hands for balance, not support, on the frame.  Slowly lower your weight, bending at the hips and knees. Keep your lower legs upright so that they are parallel with the door frame. Squat only within the range that does not increase your knee pain. Never let your hips drop below your knees.  Slowly return upright, pushing with your legs, not pulling with your hands. Repeat __________ times. Complete this exercise __________ times per day.  STRENGTH - Quadriceps, Wall Slides  Follow guidelines for form closely. Increased knee pain often results from poorly placed feet or knees.  Lean against a smooth wall or door  and walk your feet out 18-24 inches. Place your feet hip-width apart.  Slowly slide down the wall or door until your knees bend __________ degrees.* Keep your knees over your heels, not your toes, and in line with your hips, not falling to either side.  Hold for __________ seconds. Stand up to rest for __________ seconds in between each repetition. Repeat __________ times. Complete this exercise __________ times per day. * Your physician, physical therapist, or athletic trainer will alter this angle based on your symptoms and progress.   This information is not intended to replace advice given to you by your health care provider. Make sure you discuss any questions you have with your health care provider.   Document Released: 08/10/2005 Document Revised: 10/17/2014  Document Reviewed: 01/08/2009 Elsevier Interactive Patient Education [employer].

## 2015-08-13 NOTE — ED Notes (Addendum)
Pt states that she has been having pain on the side of her rt leg in the knee area x 1 wk.  Denies injury.  No swelling. No redness.

## 2018-03-06 ENCOUNTER — Other Ambulatory Visit: Payer: Self-pay | Admitting: Nurse Practitioner

## 2018-03-06 DIAGNOSIS — Z1231 Encounter for screening mammogram for malignant neoplasm of breast: Secondary | ICD-10-CM

## 2018-03-27 ENCOUNTER — Ambulatory Visit
Admission: RE | Admit: 2018-03-27 | Discharge: 2018-03-27 | Disposition: A | Discharge status: Home / Self Care | Payer: BLUE CROSS/BLUE SHIELD | Source: Ambulatory Visit | Attending: Nurse Practitioner | Admitting: Nurse Practitioner

## 2018-03-27 DIAGNOSIS — Z1231 Encounter for screening mammogram for malignant neoplasm of breast: Secondary | ICD-10-CM

## 2019-12-02 ENCOUNTER — Other Ambulatory Visit: Payer: Medicaid Other

## 2019-12-27 ENCOUNTER — Ambulatory Visit: Payer: Medicaid Other | Attending: Internal Medicine

## 2019-12-27 DIAGNOSIS — Z23 Encounter for immunization: Secondary | ICD-10-CM

## 2019-12-27 NOTE — Progress Notes (Signed)
   Covid-19 Vaccination Clinic  Name:  Kearia Yin    MRN: [id number] DOB: [birth year]  12/27/2019  Ms. Pollan was observed post Covid-19 immunization for 15 minutes without incident. She was provided with Vaccine Information Sheet and instruction to access the V-Safe system.   Ms. Nedrow was instructed to call 911 with any severe reactions post vaccine: [name] Kitchen Difficulty breathing  . Swelling of face and throat  . A fast heartbeat  . A bad rash all over body  . Dizziness and weakness   Immunizations Administered    Name Date Dose VIS Date Route   Pfizer COVID-19 Vaccine 12/27/2019  9:19 AM 0.3 mL 09/20/2019 Intramuscular   Manufacturer: [place]   Lot: [id number]   [place]: [id number]

## 2020-01-21 ENCOUNTER — Ambulatory Visit: Payer: Medicaid Other | Attending: Internal Medicine

## 2020-01-21 DIAGNOSIS — Z23 Encounter for immunization: Secondary | ICD-10-CM

## 2020-01-21 NOTE — Progress Notes (Signed)
   Covid-19 Vaccination Clinic  Name:  Penny Yu    MRN: [id number] DOB: August 10, 1976  01/21/2020  [name] was observed post Covid-19 immunization for 15 minutes without incident. She was provided with Vaccine Information Sheet and instruction to access the V-Safe system.   [name] was instructed to call 911 with any severe reactions post vaccine: [name] Kitchen Difficulty breathing  . Swelling of face and throat  . A fast heartbeat  . A bad rash all over body  . Dizziness and weakness   Immunizations Administered    Name Date Dose VIS Date Route   Pfizer COVID-19 Vaccine 01/21/2020 10:26 AM 0.3 mL 09/20/2019 Intramuscular   Manufacturer: [place]   Lot: [id number]   [place]: [id number]

## 2020-05-21 ENCOUNTER — Other Ambulatory Visit: Payer: Self-pay | Admitting: Nurse Practitioner

## 2020-05-21 DIAGNOSIS — Z1231 Encounter for screening mammogram for malignant neoplasm of breast: Secondary | ICD-10-CM

## 2020-06-09 ENCOUNTER — Ambulatory Visit
Admission: RE | Admit: 2020-06-09 | Discharge: 2020-06-09 | Disposition: A | Discharge status: Home / Self Care | Payer: Medicaid Other | Source: Ambulatory Visit | Attending: Nurse Practitioner | Admitting: Nurse Practitioner

## 2020-06-09 ENCOUNTER — Other Ambulatory Visit: Payer: Self-pay

## 2020-06-09 DIAGNOSIS — Z1231 Encounter for screening mammogram for malignant neoplasm of breast: Secondary | ICD-10-CM

## 2021-04-26 ENCOUNTER — Other Ambulatory Visit: Payer: Self-pay

## 2021-04-26 ENCOUNTER — Other Ambulatory Visit (HOSPITAL_COMMUNITY)
Admission: RE | Admit: 2021-04-26 | Discharge: 2021-04-26 | Disposition: A | Discharge status: Home / Self Care | Payer: Medicaid Other | Source: Ambulatory Visit | Attending: Obstetrics & Gynecology | Admitting: Obstetrics & Gynecology

## 2021-04-26 ENCOUNTER — Ambulatory Visit (HOSPITAL_BASED_OUTPATIENT_CLINIC_OR_DEPARTMENT_OTHER)
Admission: RE | Admit: 2021-04-26 | Discharge: 2021-04-26 | Disposition: A | Discharge status: Home / Self Care | Payer: Medicaid Other | Source: Ambulatory Visit | Attending: Obstetrics & Gynecology | Admitting: Obstetrics & Gynecology

## 2021-04-26 ENCOUNTER — Ambulatory Visit (INDEPENDENT_AMBULATORY_CARE_PROVIDER_SITE_OTHER): Payer: Medicaid Other | Admitting: Obstetrics & Gynecology

## 2021-04-26 ENCOUNTER — Encounter: Payer: Self-pay | Admitting: Obstetrics & Gynecology

## 2021-04-26 VITALS — BP 125/78 | HR 81 | Ht 64.0 in | Wt 286.1 lb

## 2021-04-26 DIAGNOSIS — N939 Abnormal uterine and vaginal bleeding, unspecified: Secondary | ICD-10-CM

## 2021-04-26 DIAGNOSIS — N841 Polyp of cervix uteri: Secondary | ICD-10-CM

## 2021-04-26 DIAGNOSIS — N83201 Unspecified ovarian cyst, right side: Secondary | ICD-10-CM | POA: Diagnosis not present

## 2021-04-26 DIAGNOSIS — N83202 Unspecified ovarian cyst, left side: Secondary | ICD-10-CM

## 2021-04-26 DIAGNOSIS — N809 Endometriosis, unspecified: Secondary | ICD-10-CM

## 2021-04-26 NOTE — Patient Instructions (Addendum)
Endometrial Biopsy  An endometrial biopsy is a procedure to remove tissue samples from the endometrium, which is the lining of the uterus. The tissue that is removed canthen be checked under a microscope for disease. This procedure is used to diagnose conditions such as endometrial cancer, endometrial tuberculosis, polyps, or other inflammatory conditions. This procedure may also be used to investigate uterine bleeding to determine where you are in your menstrual cycle or how your hormone levels are affecting thelining of the uterus. Tell a health care provider about: Any allergies you have. All medicines you are taking, including vitamins, herbs, eye drops, creams, and over-the-counter medicines. Any problems you or family members have had with anesthetic medicines. Any blood disorders you have. Any surgeries you have had. Any medical conditions you have. Whether you are pregnant or may be pregnant. What are the risks? Generally, this is a safe procedure. However, problems may occur, including: Bleeding. Pelvic infection. Puncture of the wall of the uterus with the biopsy device (rare). Allergic reactions to medicines. What happens before the procedure? Keep a record of your menstrual cycles as told by your health care provider. You may need to schedule your procedure for a specific time in your cycle. You may want to bring a sanitary pad to wear after the procedure. Plan to have someone take you home from the hospital or clinic. Ask your health care provider about: Changing or stopping your regular medicines. This is especially important if you are taking diabetes medicines, arthritis medicines, or blood thinners. Taking medicines such as aspirin and ibuprofen. These medicines can thin your blood. Do not take these medicines unless your health care provider tells you to take them. Taking over-the-counter medicines, vitamins, herbs, and supplements. What happens during the procedure? You  will lie on an exam table with your feet and legs supported as in a pelvic exam. Your health care provider will insert an instrument (speculum) into your vagina to see your cervix. Your cervix will be cleansed with an antiseptic solution. A medicine (local anesthetic) will be used to numb the cervix. A forceps instrument (tenaculum) will be used to hold your cervix steady for the biopsy. A thin, rod-like instrument (uterine sound) will be inserted through your cervix to determine the length of your uterus and the location where the biopsy sample will be removed. A thin, flexible tube (catheter) will be inserted through your cervix and into the uterus. The catheter will be used to collect the biopsy sample from your endometrial tissue. The catheter and speculum will then be removed, and the tissue sample will be sent to a lab for examination. The procedure may vary among health care providers and hospitals. What can I expect after procedure? You will rest in a recovery area until you are ready to go home. You may have mild cramping and a small amount of vaginal bleeding. This is normal. You may have a small amount of vaginal bleeding for a few days. This is normal. It is up to you to get the results of your procedure. Ask your health care provider, or the department that is doing the procedure, when your results will be ready. Follow these instructions at home: Take over-the-counter and prescription medicines only as told by your health care provider. Do not douche, use tampons, or have sexual intercourse until your health care provider approves. Return to your normal activities as told by your health care provider. Ask your health care provider what activities are safe for you. Follow instructions from  your health care provider about any activity restrictions, such as restrictions on strenuous exercise or heavy lifting. Keep all follow-up visits. This is important. Contact a health care  provider: You have heavy bleeding, or bleed for longer than 2 days after the procedure. You have bad smelling discharge from your vagina. You have a fever or chills. You have a burning sensation when urinating or you have difficulty urinating. You have severe pain in your lower abdomen. Get help right away if you: You have severe cramps in your stomach or back. You pass large blood clots. Your bleeding increases. You become weak or light-headed, or you faint or lose consciousness. Summary An endometrial biopsy is a procedure to remove tissue samples is taken from the endometrium, which is the lining of the uterus. The tissue sample that is removed will be checked under a microscope for disease. This procedure is used to diagnose conditions such as endometrial cancer, endometrial tuberculosis, polyps, or other inflammatory conditions. After the procedure, it is common to have mild cramping and a small amount of vaginal bleeding for a few days. Do not douche, use tampons, or have sexual intercourse until your health care provider approves. Ask your health care provider which activities are safe for you. This information is not intended to replace advice given to you by your health care provider. Make sure you discuss any questions you have with your healthcare provider. Document Revised: 04/20/2020 Document Reviewed: 04/20/2020 Elsevier Patient Education  2022 [place].   Abnormal Uterine Bleeding  Abnormal uterine bleeding is unusual bleeding from the uterus. It includes bleeding after sex, or bleeding or spotting between menstrual periods. It may also include bleeding that is heavier than normal, menstrual periods that lastlonger than usual, or bleeding that occurs after menopause. Abnormal uterine bleeding can affect teenagers, women in their reproductive years, pregnant women, and women who have reached menopause. Common causes of abnormal uterine bleeding  include: Pregnancy. Growths of tissue (polyps). Benign tumors or growths in the uterus (fibroids). These are not cancer. Infection. Cancer. Too much or too little of some hormones in the body (hormonal imbalances). Any type of abnormal bleeding should be checked by a health care provider. Many cases are minor and simple to treat, but others may be more serious. Treatmentwill depend on the cause and severity of the bleeding. Follow these instructions at home: Medicines Take over-the-counter and prescription medicines only as told by your health care provider. Tell your health care provider about other medicines that you take. You may be asked to stop taking aspirin or medicines that contain aspirin. These medicines can make bleeding worse. If you were prescribed iron pills, take them as told by your health care provider. Iron pills help to replace iron that your body loses because of this condition. Managing constipation In cases of severe bleeding, you may be asked to increase your iron intake to treat anemia. This may cause constipation. To prevent or treat constipation, you may need to: Drink enough fluid to keep your urine pale yellow. Take over-the-counter or prescription medicines. Eat foods that are high in fiber, such as beans, whole grains, and fresh fruits and vegetables. Limit foods that are high in fat and processed sugars, such as fried or sweet foods. General instructions Monitor your condition for any changes. Do not use tampons, douche, or have sex until your health care provider says these things are okay. Change your pads often. Get regular exams. This includes pelvic exams and cervical cancer screenings. It is  up to you to get the results of any tests that are done. Ask your health care provider, or the department that is doing the tests, when your results will be ready. Keep all follow-up visits as told by your health care provider. This is important. Contact a health  care provider if you: Have bleeding that lasts for more than 1 week. Feel dizzy at times. Feel nauseous or you vomit. Feel light-headed or weak. Notice any other changes that show that your condition is getting worse. Get help right away if you: Pass out. Have bleeding that soaks through a pad every hour. Have pain in the abdomen. Have a fever or chills. Become sweaty or weak. Pass large blood clots from your vagina. Summary Abnormal uterine bleeding is unusual bleeding from the uterus. Any type of abnormal bleeding should be evaluated by a health care provider. Many cases are minor and simple to treat, but others may be more serious. Treatment will depend on the cause of the bleeding. Get help right away if you pass out, you have bleeding that soaks through a pad every hour, or you pass large blood clots from your vagina. This information is not intended to replace advice given to you by your health care provider. Make sure you discuss any questions you have with your healthcare provider. Document Revised: 06/03/2020 Document Reviewed: 07/30/2019 Elsevier Patient Education  [place].

## 2021-04-26 NOTE — Addendum Note (Signed)
Addended by: Verita Schneiders A on: 04/26/2021 01:36 PM   Modules accepted: Orders

## 2021-04-26 NOTE — Progress Notes (Signed)
Ultrasound Result Addendum US PELVIC COMPLETE WITH TRANSVAGINAL  Result Date: 04/26/2021 CLINICAL DATA:  Irregular menstrual bleeding, history of Caesarean section, D&C, hysteroscopy, past history endometriosis; LMP 03/24/2021 EXAM: TRANSABDOMINAL AND TRANSVAGINAL ULTRASOUND OF PELVIS TECHNIQUE: Both transabdominal and transvaginal ultrasound examinations of the pelvis were performed. Transabdominal technique was performed for global imaging of the pelvis including uterus, ovaries, adnexal regions, and pelvic cul-de-sac. It was necessary to proceed with endovaginal exam following the transabdominal exam to visualize the uterus, endometrium, and to characterize a cystic LEFT ovarian lesion. COMPARISON:  None FINDINGS: Uterus Measurements: 7.6 x 5.8 x 6.0 cm = volume: 137 mL. Retroverted. Normal morphology without mass Endometrium Thickness: 6 mm.  No endometrial fluid focal abnormality Right ovary Measurements: 6.9 x 4.2 x 5.6 cm = volume: 83 mL. Complex cystic mass within RIGHT ovary 4.2 x 4.5 x 4.6 cm containing multiple loculations and septations as well as scattered internal echogenicity, concerning for an ovarian neoplasm Left ovary Measurements: 9.5 x 6.5 x 7.5 cm = volume: 241 mL. Complicated cystic lesion 7.4 x 5.5 x 6.8 cm within LEFT ovary containing thin septations and internal daughter cyst. No discrete mural nodularity. Blood flow is detected within a thin septations. Other findings No free pelvic fluid.  No additional adnexal masses. IMPRESSION: Unremarkable uterus and endometrial complex. 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 These results will be called to the ordering clinician or representative by the Radiologist Assistant, and communication documented in the PACS or [employer]. Electronically Signed   By: Penny Yu M.D.   On: 04/26/2021 12:01     Discussed findings with patient over the phone.  Torsion precautions advised. Discussed concern about risk of neoplastic process, but also emphasized that these could be endometriomas given her history of endometriosis.  Will check ovarian cancer markers (CA-125, HE4), discuss surgical management during her upcoming visit with Dr. Ihor Yu on 05/10/21.  All her questions answered. She will return this week for recommended labs, will follow up results and manage accordingly.    Penny Schneiders, MD

## 2021-04-26 NOTE — Progress Notes (Signed)
GYNECOLOGY OFFICE VISIT NOTE  History:   Penny Yu is a 45 y.o. G1P1 here today to establish care and to talk about management of irregular menstrual periods. Long history of AUB, reported being diagnosed with endometriosis via laparoscopy around 2008.  Has minimal pain but more irregular periods. Can have a bleeding episode that lasts for weeks, then skip months. Last episode of bleeding occurred from middle of 02/2021 to early 03/2021.  She had one cesarean section 11 years ago, wants a hysterectomy as she is tired of dealing with this. Has not seen a gynecologist in several years, her paps and mammograms are done by and ordered by her PCP.  Last pap at the beginning of this year and mammogram were normal as per her report.   She denies any current abnormal vaginal discharge, bleeding, pelvic pain or other concerns.    Past Medical History:  Diagnosis Date   Anxiety    Asthma    Depression    Endometriosis determined by laparoscopy    GERD (gastroesophageal reflux disease)    Headache(784.0)    Hypertension    with current pregnancy   Hypothyroidism    Morbid obesity ([place])    Shortness of breath     Past Surgical History:  Procedure Laterality Date   CESAREAN SECTION  2006, 2007   DIAGNOSTIC LAPAROSCOPY  2008   Endometriosis diagnosed   DILATATION & CURRETTAGE/HYSTEROSCOPY WITH RESECTOCOPE N/A 05/03/2013   Procedure: [place];  Surgeon: Marvene Staff, MD;  Location: [facility];  Service: Gynecology;  Laterality: N/A;    The following portions of the patient's history were reviewed and updated as appropriate: allergies, current medications, past family history, past medical history, past social history, past surgical history and problem list.   Health Maintenance:  Normal pap and negative HRHPV and normal mammogram around 10/2020 as per her report (ROI pending).   Review of Systems:  Pertinent items noted in HPI and remainder of  comprehensive ROS otherwise negative.  Physical Exam:  BP 125/78   Pulse 81   Ht 5' 4"  (1.626 m)   Wt 286 lb 1.3 oz (129.8 kg)   BMI 49.11 kg/m  CONSTITUTIONAL: Well-developed, well-nourished female in no acute distress.  HEENT:  Normocephalic, atraumatic. External right and left ear normal. No scleral icterus.  NECK: Normal range of motion, supple, no masses noted on observation SKIN: No rash noted. Not diaphoretic. No erythema. No pallor. MUSCULOSKELETAL: Normal range of motion. No edema noted. NEUROLOGIC: Alert and oriented to person, place, and time. Normal muscle tone coordination. No cranial nerve deficit noted. PSYCHIATRIC: Normal mood and affect. Normal behavior. Normal judgment and thought content. CARDIOVASCULAR: Normal heart rate noted RESPIRATORY: Effort and breath sounds normal, no problems with respiration noted ABDOMEN: No masses noted. No other overt distention noted.   PELVIC: Normal appearing external genitalia; normal urethral meatus; normal appearing vaginal mucosa. 1 cm x 0.5 cm polypoid lesion noted in the ectocervix with narrow stalk.  No abnormal discharge noted. Performed in the presence of a chaperone  CERVICAL POLYPECTOMY NOTE Risks of the biopsy including pain, bleeding, infection, inadequate specimen, and need for additional procedures were discussed. The patient stated understanding and agreed to undergo procedure today. Verbal consent was obtained,  time out performed.  The patient's cervix was prepped with Betadine.  Ring forceps were used to grasp the polypoid lesion and the polypoid lesions was removed by twisting it off its base.  Tissue sent to pathology for analysis.  Small bleeding was noted and hemostasis was achieved using silver nitrate sticks and Monsel's solution.  The patient tolerated the procedure well. Post-procedure instructions were given to the patient. The patient is to call with heavy bleeding, fever greater than 100.4, foul smelling vaginal  discharge or other concerns.      Assessment and Plan:     1. Abnormal uterine bleeding (AUB) 2. Endometriosis 3. Polyp at cervical os Patient has abnormal uterine bleeding . Exam remarkable for cervical polyp which was removed.  Will order abnormal uterine bleeding evaluation labs and pelvic ultrasound to evaluate for any other structural gynecologic abnormalities.  Patient informed of need for endometrial biopsy, will be done next visit and premedication with Ibuprofen recommended. Release of information forms signed to get records about her surgery, endometriosis diagnosis, previous paps and mammograms. Will discuss management after records and results have been reviewed. - US PELVIC COMPLETE WITH TRANSVAGINAL; Future - CBC - Hemoglobin P5V - Follicle stimulating hormone - TSH+Prl+TestT+TestF+17OHP - Beta hCG quant (ref lab) - Surgical pathology  Routine preventative health maintenance measures emphasized. Please refer to After Visit Summary for other counseling recommendations.   Return in about 2 weeks (around 05/10/2021) for Followup with [facility] or other surgeon to discuss management, also needs endometrial biopsy.    I spent  27 minutes dedicated to the care of this patient including pre-visit review of records, face to face time with the patient discussing her conditions and treatments and post visit ordering of testing.    Verita Schneiders, MD, [place] for [employer], [place]

## 2021-04-27 ENCOUNTER — Other Ambulatory Visit: Payer: Self-pay

## 2021-04-27 DIAGNOSIS — N83201 Unspecified ovarian cyst, right side: Secondary | ICD-10-CM

## 2021-04-27 DIAGNOSIS — N83202 Unspecified ovarian cyst, left side: Secondary | ICD-10-CM

## 2021-04-27 LAB — SURGICAL PATHOLOGY

## 2021-04-28 LAB — CA 125: Cancer Antigen (CA) 125: 11.4 U/mL (ref 0.0–38.1)

## 2021-04-28 LAB — HUMAN EPIDIDYMIS PROTEIN 4: HE4: 54 pmol/L (ref 0.0–63.6)

## 2021-04-29 LAB — TSH+PRL+TESTT+TESTF+17OHP
17-Hydroxyprogesterone: 49 ng/dL
Prolactin: 23.1 ng/mL (ref 4.8–23.3)
TSH: 2.59 u[IU]/mL (ref 0.450–4.500)
Testosterone, Free: 0.7 pg/mL (ref 0.0–4.2)
Testosterone, Total, LC/MS: 14.8 ng/dL

## 2021-04-29 LAB — CBC
Hematocrit: 35.5 % (ref 34.0–46.6)
Hemoglobin: 11.2 g/dL (ref 11.1–15.9)
MCH: 22.7 pg — ABNORMAL LOW (ref 26.6–33.0)
MCHC: 31.5 g/dL (ref 31.5–35.7)
MCV: 72 fL — ABNORMAL LOW (ref 79–97)
Platelets: 537 10*3/uL — ABNORMAL HIGH (ref 150–450)
RBC: 4.94 x10E6/uL (ref 3.77–5.28)
RDW: 18 % — ABNORMAL HIGH (ref 11.7–15.4)
WBC: 6.6 10*3/uL (ref 3.4–10.8)

## 2021-04-29 LAB — HEMOGLOBIN A1C
Est. average glucose Bld gHb Est-mCnc: 114 mg/dL
Hgb A1c MFr Bld: 5.6 % (ref 4.8–5.6)

## 2021-04-29 LAB — BETA HCG QUANT (REF LAB): hCG Quant: <1 m[IU]/mL

## 2021-04-29 LAB — FOLLICLE STIMULATING HORMONE: FSH: 3.5 m[IU]/mL

## 2021-05-10 ENCOUNTER — Encounter: Payer: Self-pay | Admitting: Obstetrics & Gynecology

## 2021-05-10 ENCOUNTER — Telehealth: Payer: Self-pay

## 2021-05-10 ENCOUNTER — Ambulatory Visit (INDEPENDENT_AMBULATORY_CARE_PROVIDER_SITE_OTHER): Payer: Medicaid Other | Admitting: Obstetrics & Gynecology

## 2021-05-10 ENCOUNTER — Encounter: Payer: Self-pay | Admitting: General Practice

## 2021-05-10 ENCOUNTER — Other Ambulatory Visit (HOSPITAL_COMMUNITY)
Admission: RE | Admit: 2021-05-10 | Discharge: 2021-05-10 | Disposition: A | Discharge status: Home / Self Care | Payer: Medicaid Other | Source: Ambulatory Visit | Attending: Obstetrics & Gynecology | Admitting: Obstetrics & Gynecology

## 2021-05-10 ENCOUNTER — Other Ambulatory Visit: Payer: Self-pay

## 2021-05-10 VITALS — BP 111/65 | HR 81 | Wt 282.0 lb

## 2021-05-10 DIAGNOSIS — N809 Endometriosis, unspecified: Secondary | ICD-10-CM | POA: Diagnosis not present

## 2021-05-10 DIAGNOSIS — N939 Abnormal uterine and vaginal bleeding, unspecified: Secondary | ICD-10-CM | POA: Diagnosis not present

## 2021-05-10 DIAGNOSIS — N9489 Other specified conditions associated with female genital organs and menstrual cycle: Secondary | ICD-10-CM

## 2021-05-10 DIAGNOSIS — Z3202 Encounter for pregnancy test, result negative: Secondary | ICD-10-CM | POA: Diagnosis not present

## 2021-05-10 DIAGNOSIS — N83201 Unspecified ovarian cyst, right side: Secondary | ICD-10-CM

## 2021-05-10 DIAGNOSIS — N83202 Unspecified ovarian cyst, left side: Secondary | ICD-10-CM

## 2021-05-10 LAB — POCT URINE PREGNANCY: Preg Test, Ur: NEGATIVE

## 2021-05-10 NOTE — Progress Notes (Signed)
History:  45 y.o. G1P1 with h/o AUB and bilateral ov cysts here today for endobx and discussion of management of cysts. Pt was seem by Dr. Harolyn Rutherford and was referred for consideration of robotic hysterectomy. Long history of AUB, reported being diagnosed with endometriosis via laparoscopy around 2008.  Has minimal pain but more irregular periods. Can have a bleeding episode that lasts for weeks, then skip months. Last episode of bleeding occurred from middle of 02/2021 to early 03/2021.  She had one cesarean section 11 years ago, wants a hysterectomy as she is tired of dealing with this. Has not seen a gynecologist in several years, her paps and mammograms are done by and ordered by her PCP.  Last pap at the beginning of this year and mammogram were normal as per her report.   She denies any current abnormal vaginal discharge, bleeding, pelvic pain or other concerns.  The following portions of the patient's history were reviewed and updated as appropriate: allergies, current medications, past family history, past medical history, past social history, past surgical history and problem list.  Review of Systems:  Pertinent items are noted in HPI.    Objective:  Physical Exam Blood pressure 111/65, pulse 81, weight 282 lb (127.9 kg), last menstrual period 05/08/2021.  CONSTITUTIONAL: Well-developed, well-nourished female in no acute distress.  HENT:  Normocephalic, atraumatic EYES: Conjunctivae and EOM are normal. No scleral icterus.  NECK: Normal range of motion SKIN: Skin is warm and dry. No rash noted. Not diaphoretic.No pallor. [place]: Alert and oriented to person, place, and time. Normal coordination.  Abd: Soft, nontender and nondistended. Well healed transverse incision.  Pelvic: Normal appearing external genitalia; normal appearing vaginal mucosa and cervix.  Normal discharge.  Small uterus, no other palpable masses, no uterine or adnexal tenderness  The indications for endometrial biopsy  were reviewed.   Risks of the biopsy including cramping, bleeding, infection, uterine perforation, inadequate specimen and need for additional procedures  were discussed. The patient states she understands and agrees to undergo procedure today. Consent was signed. Time out was performed. Urine HCG was negative. A sterile speculum was placed in the patient's vagina and the cervix was prepped with Betadine. A single-toothed tenaculum was placed on the anterior lip of the cervix to stabilize it. The 3 mm pipelle was introduced into the endometrial cavity without difficulty to a depth of 9cm, and a moderate amount of tissue was obtained and sent to pathology. The instruments were removed from the patient's vagina. Minimal bleeding from the cervix was noted.      Labs and Imaging US PELVIC COMPLETE WITH TRANSVAGINAL  Result Date: 04/26/2021 CLINICAL DATA:  Irregular menstrual bleeding, history of Caesarean section, D&C, hysteroscopy, past history endometriosis; LMP 03/24/2021 EXAM: TRANSABDOMINAL AND TRANSVAGINAL ULTRASOUND OF PELVIS TECHNIQUE: Both transabdominal and transvaginal ultrasound examinations of the pelvis were performed. Transabdominal technique was performed for global imaging of the pelvis including uterus, ovaries, adnexal regions, and pelvic cul-de-sac. It was necessary to proceed with endovaginal exam following the transabdominal exam to visualize the uterus, endometrium, and to characterize a cystic LEFT ovarian lesion. COMPARISON:  None FINDINGS: Uterus Measurements: 7.6 x 5.8 x 6.0 cm = volume: 137 mL. Retroverted. Normal morphology without mass Endometrium Thickness: 6 mm.  No endometrial fluid focal abnormality Right ovary Measurements: 6.9 x 4.2 x 5.6 cm = volume: 83 mL. Complex cystic mass within RIGHT ovary 4.2 x 4.5 x 4.6 cm containing multiple loculations and septations as well as scattered internal echogenicity, concerning for an  ovarian neoplasm Left ovary Measurements: 9.5 x 6.5 x 7.5  cm = volume: 241 mL. Complicated cystic lesion 7.4 x 5.5 x 6.8 cm within LEFT ovary containing thin septations and internal daughter cyst. No discrete mural nodularity. Blood flow is detected within a thin septations. Other findings No free pelvic fluid.  No additional adnexal masses. IMPRESSION: Unremarkable uterus and endometrial complex. 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 These results will be called to the ordering clinician or representative by the Radiologist Assistant, and communication documented in the PACS or [employer]. Electronically Signed   By: Lavonia Dana M.D.   On: 04/26/2021 12:01    Assessment & Plan:  AUB Bilateral ovarian cysts with neg tumor markers.   Patient desires surgical management with robot assisted total lap hysterectomy with bilateral salpingo-oophorectomy.  The risks of surgery were discussed in detail with the patient including but not limited to: bleeding which may require transfusion or reoperation; infection which may require prolonged hospitalization or re-hospitalization and antibiotic therapy; injury to bowel, bladder, ureters and major vessels or other surrounding organs; need for additional procedures including laparotomy; thromboembolic phenomenon, incisional problems and other postoperative or anesthesia complications.  Patient was told that the likelihood that her condition and symptoms will be treated effectively with this surgical management was very high; the postoperative expectations were also discussed in detail. The patient also understands the alternative treatment options which were discussed in full. All questions were answered.  She was told that she will be contacted by our surgical scheduler regarding the time and date of her surgery; routine preoperative instructions of having nothing to eat or drink after  midnight on the day prior to surgery and also coming to the hospital 1 1/2 hours prior to her time of surgery were also emphasized.  She was told she may be called for a preoperative appointment about a week prior to surgery and will be given further preoperative instructions at that visit. Printed patient education handouts about the procedure were given to the patient to review at home.   The patient tolerated the procedure well. Routine post-procedure instructions were given to the patient. The patient will follow up to review the results and for further management.  Starlyn Droge L. Harraway-Smith, M.D., [facility]

## 2021-05-10 NOTE — Telephone Encounter (Signed)
-----   Message from Maurine Minister, [place] sent at 05/10/2021 11:38 AM EDT ----- Regarding: Labs Patient is scheduled on 05/10/2021 for CT Scan and will need labs before her visit.  Per Cecille Rubin, she needs BUN,Creatine, and JFR prior to her appt.  Pt will need to be notified.  Therefore, just let me know once order is in and I can call to schedule her.  Thanks

## 2021-05-10 NOTE — Patient Instructions (Signed)
Total Laparoscopic Hysterectomy A total laparoscopic hysterectomy is a minimally invasive surgery to remove the uterus and cervix. The fallopian tubes and ovaries can also be removed during this surgery, if necessary. This procedure may be done to treat problems such as: Growths in the uterus (uterine fibroids) that are not cancer but cause symptoms. A condition that causes the lining of the uterus to grow in other areas (endometriosis). Problems with pelvic support. Cancer of the cervix, ovaries, uterus, or tissue that lines the uterus (endometrium). Excessive bleeding in the uterus. After this procedure, you will no longer be able to have a baby, and you willno longer have a menstrual period. Tell a health care provider about: Any allergies you have. All medicines you are taking, including vitamins, herbs, eye drops, creams, and over-the-counter medicines. Any problems you or family members have had with anesthetic medicines. Any blood disorders you have. Any surgeries you have had. Any medical conditions you have. Whether you are pregnant or may be pregnant. What are the risks? Generally, this is a safe procedure. However, problems may occur, including: Infection. Bleeding. Blood clots in the legs or lungs. Allergic reactions to medicines. Damage to nearby structures or organs. Having to change from this surgery to one in which a large incision is made in the abdomen (abdominal hysterectomy). What happens before the procedure? Staying hydrated Follow instructions from your health care provider about hydration, which may include: Up to 2 hours before the procedure - you may continue to drink clear liquids, such as water, clear fruit juice, black coffee, and plain tea.  Eating and drinking restrictions Follow instructions from your health care provider about eating and drinking, which may include: 8 hours before the procedure - stop eating heavy meals or foods, such as meat, fried  foods, or fatty foods. 6 hours before the procedure - stop eating light meals or foods, such as toast or cereal. 6 hours before the procedure - stop drinking milk or drinks that contain milk. 2 hours before the procedure - stop drinking clear liquids. Medicines Ask your health care provider about: Changing or stopping your regular medicines. This is especially important if you are taking diabetes medicines or blood thinners. Taking medicines such as aspirin and ibuprofen. These medicines can thin your blood. Do not take these medicines unless your health care provider tells you to take them. Taking over-the-counter medicines, vitamins, herbs, and supplements. You may be asked to take medicine that helps you have a bowel movement (laxative) to prevent constipation. General instructions If you were asked to do bowel preparation before the procedure, follow instructions from your health care provider. This procedure can affect the way you feel about yourself. Talk with your health care provider about the physical and emotional changes hysterectomy may cause. Do not use any products that contain nicotine or tobacco for at least 4 weeks before the procedure. These products include cigarettes, chewing tobacco, and vaping devices, such as e-cigarettes. If you need help quitting, ask your health care provider. Plan to have a responsible adult take you home from the hospital or clinic. Plan to have a responsible adult care for you for the time you are told after you leave the hospital or clinic. This is important. Surgery safety Ask your health care provider: How your surgery site will be marked. What steps will be taken to help prevent infection. These may include: Removing hair at the surgery site. Washing skin with a germ-killing soap. Receiving antibiotic medicine. What happens during the procedure?  An IV will be inserted into one of your veins. You will be given one or more of the following: A  medicine to help you relax (sedative). A medicine to make you fall asleep (general anesthetic). A medicine to numb the area (local anesthetic). A medicine that is injected into your spine to numb the area below and slightly above the injection site (spinal anesthetic). A medicine that is injected into an area of your body to numb everything below the injection site (regional anesthetic). A gas will be used to inflate your abdomen. This will allow your surgeon to look inside your abdomen and do the surgery. Three or four small incisions will be made in your abdomen. A small device with a light (laparoscope) will be inserted into one of your incisions. Surgical instruments will be inserted through the other incisions in order to perform the procedure. Your uterus and cervix may be removed through your vagina or cut into small pieces and removed through the small incisions. Any other organs that need to be removed will also be removed this way. The gas will be released from inside your abdomen. Your incisions will be closed with stitches (sutures), skin glue, or adhesive strips. A bandage (dressing) may be placed over your incisions. The procedure may vary among health care providers and hospitals. What happens after the procedure? Your blood pressure, heart rate, breathing rate, and blood oxygen level will be monitored until you leave the hospital or clinic. You will be given medicine for pain as needed. You will be encouraged to walk as soon as possible. You will also use a device to help you breathe or do breathing exercises to keep your lungs clear. You may have to wear compression stockings. These stockings help to prevent blood clots and reduce swelling in your legs. You will need to wear a sanitary pad for vaginal discharge or bleeding. Summary Total laparoscopic hysterectomy is a procedure to remove your uterus, cervix, and sometimes the fallopian tubes and ovaries. This procedure can  affect the way you feel about yourself. Talk with your health care provider about the physical and emotional changes hysterectomy may cause. After this procedure, you will no longer be able to have a baby, and you will no longer have a menstrual period. You will be given pain medicine to control discomfort after this procedure. Plan to have a responsible adult take you home from the hospital or clinic. This information is not intended to replace advice given to you by your health care provider. Make sure you discuss any questions you have with your healthcare provider. Document Revised: 05/29/2020 Document Reviewed: 05/29/2020 Elsevier Patient Education  [place]. Total Laparoscopic Hysterectomy, Care After The following information offers guidance on how to care for yourself after your procedure. Your health care provider may also give you more specific instructions. If you have problems or questions, contact your health careprovider. What can I expect after the procedure? After the procedure, it is common to have: Pain, bruising, and numbness around your incisions. Tiredness (fatigue). Poor appetite. Less interest in sex. Vaginal discharge or bleeding. You will need to use a sanitary pad after this procedure. Feelings of sadness or other emotions. If your ovaries were also removed, it is also common to have symptoms of menopause, such as hot flashes, night sweats, and lack of sleep (insomnia). Follow these instructions at home: Medicines Take over-the-counter and prescription medicines only as told by your health care provider. Ask your health care provider if the  medicine prescribed to you: Requires you to avoid driving or using machinery. Can cause constipation. You may need to take these actions to prevent or treat constipation: Drink enough fluid to keep your urine pale yellow. Take over-the-counter or prescription medicines. Eat foods that are high in fiber, such as beans,  whole grains, and fresh fruits and vegetables. Limit foods that are high in fat and processed sugars, such as fried or sweet foods. Incision care  Follow instructions from your health care provider about how to take care of your incisions. Make sure you: Wash your hands with soap and water for at least 20 seconds before and after you change your bandage (dressing). If soap and water are not available, use hand sanitizer. Change your dressing as told by your health care provider. Leave stitches (sutures), skin glue, or adhesive strips in place. These skin closures may need to stay in place for 2 weeks or longer. If adhesive strip edges start to loosen and curl up, you may trim the loose edges. Do not remove adhesive strips completely unless your health care provider tells you to do that. Check your incision areas every day for signs of infection. Check for: More redness, swelling, or pain. Fluid or blood. Warmth. Pus or a bad smell.  Activity  Rest as told by your health care provider. Avoid sitting for a long time without moving. Get up to take short walks every 1-2 hours. This is important to improve blood flow and breathing. Ask for help if you feel weak or unsteady. Return to your normal activities as told by your health care provider. Ask your health care provider what activities are safe for you. Do not lift anything that is heavier than 10 lb (4.5 kg), or the limit that you are told, for one month after surgery or until your health care provider says that it is safe. If you were given a sedative during the procedure, it can affect you for several hours. Do not drive or operate machinery until your health care provider says that it is safe.  Lifestyle Do not use any products that contain nicotine or tobacco. These products include cigarettes, chewing tobacco, and vaping devices, such as e-cigarettes. These can delay healing after surgery. If you need help quitting, ask your health care  provider. Do not drink alcohol until your health care provider approves. General instructions  Do not douche, use tampons, or have sex for at least 6 weeks, or as told by your health care provider. If you struggle with physical or emotional changes after your procedure, speak with your health care provider or a therapist. Do not take baths, swim, or use a hot tub until your health care provider approves. You may only be allowed to take showers for 2-3 weeks. Keep your dressing dry until your health care provider says it can be removed. Try to have someone at home with you for the first 1-2 weeks to help with your daily chores. Wear compression stockings as told by your health care provider. These stockings help to prevent blood clots and reduce swelling in your legs. Keep all follow-up visits. This is important.  Contact a health care provider if: You have any of these signs of infection: Chills or a fever. More redness, swelling, or pain around an incision. Fluid or blood coming from an incision. Warmth coming from an incision. Pus or a bad smell coming from an incision. An incision opens. You feel dizzy or light-headed. You have pain or bleeding  when you urinate, or you are unable to urinate. You have abnormal vaginal discharge. You have pain that does not get better with medicine. Get help right away if: You have a fever and your symptoms suddenly get worse. You have severe abdominal pain. You have chest pain or shortness of breath. You faint. You have pain, swelling, or redness in your leg. You have heavy vaginal bleeding with blood clots, soaking through a sanitary pad in less than 1 hour. These symptoms may represent a serious problem that is an emergency. Do not wait to see if the symptoms will go away. Get medical help right away. Call your local emergency services (911 in the [place] Do not drive yourself to the hospital. Summary After the procedure, it is common to have pain  and bruising around your incisions. Do not take baths, swim, or use a hot tub until your health care provider approves. Do not lift anything that is heavier than 10 lb (4.5 kg), or the limit that you are told, for one month after surgery or until your health care provider says that it is safe. Tell your health care provider if you have any signs or symptoms of infection after the procedure. Get help right away if you have severe abdominal pain, chest pain, shortness of breath, or heavy bleeding from your vagina. This information is not intended to replace advice given to you by your health care provider. Make sure you discuss any questions you have with your healthcare provider. Document Revised: 05/29/2020 Document Reviewed: 05/29/2020 Elsevier Patient Education  [place].

## 2021-05-10 NOTE — Telephone Encounter (Signed)
Called Penny Yu to schedule appt to have labs drawn before abdomen/pelvic CT scan. Penny Yu is scheduled for labs on 05/11/21 at 8:45 am.  Henritta Mutz l Kerem Gilmer, CMA

## 2021-05-11 ENCOUNTER — Ambulatory Visit: Payer: Medicaid Other

## 2021-05-11 DIAGNOSIS — Z298 Encounter for other specified prophylactic measures: Secondary | ICD-10-CM

## 2021-05-12 ENCOUNTER — Encounter: Payer: Self-pay | Admitting: *Deleted

## 2021-05-12 LAB — SURGICAL PATHOLOGY

## 2021-05-12 LAB — CREATININE WITH EST GFR
Creatinine, Ser: 0.64 mg/dL (ref 0.57–1.00)
eGFR: 111 mL/min/{1.73_m2} (ref >59)

## 2021-05-12 LAB — BUN: BUN: 13 mg/dL (ref 6–24)

## 2021-05-18 ENCOUNTER — Other Ambulatory Visit: Payer: Self-pay | Admitting: Family Medicine

## 2021-05-18 DIAGNOSIS — Z1231 Encounter for screening mammogram for malignant neoplasm of breast: Secondary | ICD-10-CM

## 2021-05-19 ENCOUNTER — Telehealth: Payer: Self-pay

## 2021-05-19 ENCOUNTER — Encounter: Payer: Self-pay | Admitting: Obstetrics & Gynecology

## 2021-05-19 NOTE — Telephone Encounter (Signed)
Patient called back and verified name and dob. Patient made aware that endometrial biopsy was negative and that a begnin poylp was found. Patient states understanding. Kathrene Alu RN

## 2021-05-19 NOTE — Telephone Encounter (Signed)
Left message for patient to return call to office for results. Kathrene Alu RN

## 2021-05-19 NOTE — Telephone Encounter (Signed)
-----   Message from Lavonia Drafts, MD sent at 05/18/2021  4:27 PM EDT ----- Please call pt. Her endo bx was neg. A polyp was noted and benign.   Thanks.   Clh-S

## 2021-05-24 ENCOUNTER — Other Ambulatory Visit: Payer: Self-pay

## 2021-05-24 ENCOUNTER — Ambulatory Visit (HOSPITAL_BASED_OUTPATIENT_CLINIC_OR_DEPARTMENT_OTHER)
Admission: RE | Admit: 2021-05-24 | Discharge: 2021-05-24 | Disposition: A | Discharge status: Home / Self Care | Payer: Medicaid Other | Source: Ambulatory Visit | Attending: Obstetrics & Gynecology | Admitting: Obstetrics & Gynecology

## 2021-05-24 DIAGNOSIS — N939 Abnormal uterine and vaginal bleeding, unspecified: Secondary | ICD-10-CM

## 2021-05-24 DIAGNOSIS — N83202 Unspecified ovarian cyst, left side: Secondary | ICD-10-CM | POA: Diagnosis present

## 2021-05-24 DIAGNOSIS — N83201 Unspecified ovarian cyst, right side: Secondary | ICD-10-CM | POA: Diagnosis present

## 2021-05-24 MED ORDER — IOHEXOL 300 MG/ML  SOLN
75.0000 mL | Freq: Once | INTRAMUSCULAR | Status: AC | PRN
  Administered 2021-05-24: 75 mL via INTRAVENOUS

## 2021-05-25 ENCOUNTER — Ambulatory Visit: Payer: Medicaid Other

## 2021-05-25 DIAGNOSIS — N939 Abnormal uterine and vaginal bleeding, unspecified: Secondary | ICD-10-CM

## 2021-05-25 NOTE — Progress Notes (Signed)
Patient signed medicaid hysterectomy statement. Kathrene Alu RN

## 2021-05-26 ENCOUNTER — Encounter: Payer: Self-pay | Admitting: Obstetrics & Gynecology

## 2021-05-28 ENCOUNTER — Encounter: Payer: Self-pay | Admitting: General Practice

## 2021-06-09 ENCOUNTER — Encounter: Payer: Self-pay | Admitting: General Practice

## 2021-07-06 ENCOUNTER — Encounter (HOSPITAL_BASED_OUTPATIENT_CLINIC_OR_DEPARTMENT_OTHER): Payer: Self-pay | Admitting: Obstetrics & Gynecology

## 2021-07-06 ENCOUNTER — Other Ambulatory Visit: Payer: Self-pay

## 2021-07-06 DIAGNOSIS — Z01818 Encounter for other preprocedural examination: Secondary | ICD-10-CM | POA: Diagnosis not present

## 2021-07-06 NOTE — Progress Notes (Signed)
Spoke w/ via phone for pre-op interview---pt Lab needs dos----  urine poct , surgery orders pending            Lab results------lab appt 07-09-2021 at 830 am for cbc bmp t & s ekg COVID test -----07-09-2021 extended stay Arrive at -------700 am 07-13-2021 NPO after MN NO Solid Food.  Clear liquids from MN until---600 am Med rec completed Medications to take morning of surgery -----none Diabetic medication -----n/a Patient instructed no nail polish to be worn day of surgery Patient instructed to bring photo id and insurance card day of surgery Patient aware to have Driver (ride ) / caregiver    brother duane evans will stay for 24 hours after surgery  Patient Special Instructions -----pt given extended stay instructions Pre-Op special Istructions -----none Patient verbalized understanding of instructions that were given at this phone interview. Patient denies shortness of breath, chest pain, fever, cough at this phone interview.

## 2021-07-06 NOTE — Progress Notes (Signed)
YOU ARE SCHEDULED FOR A COVID TEST ON   07-09-2021  . THIS TEST MUST BE DONE BEFORE SURGERY. GO TO  [facility] @ [place]   PHONE NUMBER [phone].   TURN LEFT AT THE SHIPPING AND RECEIVING SIGN AND LOOK FOR SMALL BLUE POP UP TENT UNDER BUILDING OVERHANG AT BACK OF BUILDING AND REMAIN IN YOUR CAR, THIS IS A DRIVE UP TEST.  AFTER YOUR COVID TEST , PLEASE WEAR A MASK OUT IN PUBLIC AND SOCIAL DISTANCE AND [name] YOUR HANDS FREQUENTLY. PLEASE ASK ALL YOUR CLOSE HOUSEHOLD CONTACT TO WEAR MASK OUT IN PUBLIC AND SOCIAL DISTANCE AND [name] HANDS FREQUENTLY ALSO.      Your procedure is scheduled on 07-13-2021  Report to [facility]. M.   Call this number if you have problems the morning of surgery  [phone].   OUR ADDRESS IS [address].  WE ARE LOCATED IN [facility].  PLEASE BRING YOUR INSURANCE CARD AND PHOTO ID DAY OF SURGERY.  ONLY ONE PERSON ALLOWED IN FACILITY WAITING AREA.                                     REMEMBER:  DO NOT EAT FOOD, CANDY GUM OR MINTS  AFTER MIDNIGHT . YOU MAY HAVE CLEAR LIQUIDS FROM MIDNIGHT UNTIL 600 AM. NO CLEAR LIQUIDS AFTER 600 AM DAY OF SURGERY.   YOU MAY  BRUSH YOUR TEETH MORNING OF SURGERY AND RINSE YOUR MOUTH OUT, NO CHEWING GUM CANDY OR MINTS.    CLEAR LIQUID DIET   Foods Allowed                                                                     Foods Excluded  Coffee and tea, regular and decaf                             liquids that you cannot  Plain Jell-O any favor except red or purple                                           see through such as: Fruit ices (not with fruit pulp)                                     milk, soups, orange juice  Iced Popsicles                                    All solid food Carbonated beverages, regular and diet                                    Cranberry, grape and apple juices Sports drinks like Gatorade Lightly  seasoned clear broth  or consume(fat free) Sugar  Sample Menu Breakfast                                Lunch                                     Supper Cranberry juice                    Beef broth                            Chicken broth Jell-O                                     Grape juice                           Apple juice Coffee or tea                        Jell-O                                      Popsicle                                                Coffee or tea                        Coffee or tea  _____________________________________________________________________     TAKE THESE MEDICATIONS MORNING OF SURGERY WITH A SIP OF WATER: NONE  ONE VISITOR IS ALLOWED IN WAITING ROOM ONLY DAY OF SURGERY.  YOU MAY HAVE ANOTHER PERSON SWITCH OUT WITH THE  1  VISITOR IN THE WAITING ROOM DAY OF SURGERY AND A MASK MUST BE WORN IN THE WAITING ROOM.    2 VISITORS  MAY VISIT IN THE EXTENDED RECOVERY ROOM UNTIL 800 PM ONLY 1 VISITOR AGE 60 AND OVER MAY SPEND THE NIGHT AND MUST BE IN EXTENDED RECOVERY ROOM NO LATER THAN 800 PM .   UP TO 2 CHILDREN AGE 58 TO 15 MAY ALSO VISIT IN EXTENDED RECOVERY ROOM ONLY UNTIL 800 PM AND MUST LEAVE BY 800 PM. ALL PERSONS VISITING IN EXTENDED RECOVERY ROOM MUST WEAR A MASK.                                    DO NOT WEAR JEWERLY, MAKE UP. DO NOT WEAR LOTIONS, POWDERS, PERFUMES OR NAIL POLISH. DO NOT SHAVE FOR 48 HOURS PRIOR TO DAY OF SURGERY. MEN MAY SHAVE FACE AND NECK. CONTACTS, GLASSES, OR DENTURES MAY NOT BE WORN TO SURGERY.                                    [name] IS NOT RESPONSIBLE  FOR ANY BELONGINGS.                                                                    [name]  [name] - Preparing for Surgery Before surgery, you can play an important role.  Because skin is not sterile, your skin needs to be as free of germs as possible.  You can reduce the number of germs on your skin by washing with CHG (chlorahexidine gluconate) soap  before surgery.  CHG is an antiseptic cleaner which kills germs and bonds with the skin to continue killing germs even after washing. Please DO NOT use if you have an allergy to CHG or antibacterial soaps.  If your skin becomes reddened/irritated stop using the CHG and inform your nurse when you arrive at Short Stay. Do not shave (including legs and underarms) for at least 48 hours prior to the first CHG shower.  You may shave your face/neck. Please follow these instructions carefully:  1.  Shower with CHG Soap the night before surgery and the  morning of Surgery.  2.  If you choose to wash your hair, wash your hair first as usual with your  normal  shampoo.  3.  After you shampoo, rinse your hair and body thoroughly to remove the  shampoo.                            4.  Use CHG as you would any other liquid soap.  You can apply chg directly  to the skin and wash                      Gently with a scrungie or clean washcloth.  5.  Apply the CHG Soap to your body ONLY FROM THE NECK DOWN.   Do not use on face/ open                           Wound or open sores. Avoid contact with eyes, ears mouth and genitals (private parts).                       Wash face,  Genitals (private parts) with your normal soap.             6.  Wash thoroughly, paying special attention to the area where your surgery  will be performed.  7.  Thoroughly rinse your body with warm water from the neck down.  8.  DO NOT shower/wash with your normal soap after using and rinsing off  the CHG Soap.                9.  Pat yourself dry with a clean towel.            10.  Wear clean pajamas.            11.  Place clean sheets on your bed the night of your first shower and do not  sleep with pets. Day of Surgery : Do not apply any lotions/deodorants the morning of surgery.  Please wear clean clothes to the hospital/surgery center.  FAILURE TO FOLLOW THESE INSTRUCTIONS MAY RESULT IN THE CANCELLATION OF YOUR SURGERY PATIENT  SIGNATURE_________________________________  NURSE SIGNATURE__________________________________  ________________________________________________________________________                                                        QUESTIONS Penny Yu  Penny Yu PRE OP NURSE PHONE [phone].

## 2021-07-07 ENCOUNTER — Ambulatory Visit
Admission: RE | Admit: 2021-07-07 | Discharge: 2021-07-07 | Disposition: A | Discharge status: Home / Self Care | Payer: Medicaid Other | Source: Ambulatory Visit | Attending: Family Medicine | Admitting: Family Medicine

## 2021-07-07 DIAGNOSIS — Z1231 Encounter for screening mammogram for malignant neoplasm of breast: Secondary | ICD-10-CM

## 2021-07-09 ENCOUNTER — Other Ambulatory Visit: Payer: Self-pay | Admitting: Obstetrics & Gynecology

## 2021-07-09 ENCOUNTER — Other Ambulatory Visit: Payer: Self-pay

## 2021-07-09 ENCOUNTER — Encounter (HOSPITAL_COMMUNITY)
Admission: RE | Admit: 2021-07-09 | Discharge: 2021-07-09 | Disposition: A | Discharge status: Home / Self Care | Payer: Medicaid Other | Source: Ambulatory Visit | Attending: Obstetrics & Gynecology | Admitting: Obstetrics & Gynecology

## 2021-07-09 DIAGNOSIS — Z01818 Encounter for other preprocedural examination: Secondary | ICD-10-CM | POA: Diagnosis not present

## 2021-07-09 LAB — CBC
HCT: 36.3 % (ref 36.0–46.0)
Hemoglobin: 11.6 g/dL — ABNORMAL LOW (ref 12.0–15.0)
MCH: 25.3 pg — ABNORMAL LOW (ref 26.0–34.0)
MCHC: 32 g/dL (ref 30.0–36.0)
MCV: 79.3 fL — ABNORMAL LOW (ref 80.0–100.0)
Platelets: 427 10*3/uL — ABNORMAL HIGH (ref 150–400)
RBC: 4.58 MIL/uL (ref 3.87–5.11)
RDW: 16.6 % — ABNORMAL HIGH (ref 11.5–15.5)
WBC: 4.7 10*3/uL (ref 4.0–10.5)
nRBC: 0 % (ref 0.0–0.2)

## 2021-07-09 LAB — BASIC METABOLIC PANEL
Anion gap: 7 (ref 5–15)
BUN: 17 mg/dL (ref 6–20)
CO2: 28 mmol/L (ref 22–32)
Calcium: 8.9 mg/dL (ref 8.9–10.3)
Chloride: 105 mmol/L (ref 98–111)
Creatinine, Ser: 0.72 mg/dL (ref 0.44–1.00)
GFR, Estimated: >60 mL/min (ref >60)
Glucose, Bld: 101 mg/dL — ABNORMAL HIGH (ref 70–99)
Potassium: 3.6 mmol/L (ref 3.5–5.1)
Sodium: 140 mmol/L (ref 135–145)

## 2021-07-13 ENCOUNTER — Ambulatory Visit (HOSPITAL_BASED_OUTPATIENT_CLINIC_OR_DEPARTMENT_OTHER): Payer: Medicaid Other | Admitting: Anesthesiology

## 2021-07-13 ENCOUNTER — Encounter (HOSPITAL_BASED_OUTPATIENT_CLINIC_OR_DEPARTMENT_OTHER)
Admission: RE | Disposition: A | Discharge status: Home / Self Care | Payer: Self-pay | Source: Ambulatory Visit | Attending: Obstetrics & Gynecology

## 2021-07-13 ENCOUNTER — Other Ambulatory Visit: Payer: Self-pay

## 2021-07-13 ENCOUNTER — Ambulatory Visit (HOSPITAL_BASED_OUTPATIENT_CLINIC_OR_DEPARTMENT_OTHER)
Admission: RE | Admit: 2021-07-13 | Discharge: 2021-07-13 | Disposition: A | Discharge status: Home / Self Care | Payer: Medicaid Other | Source: Ambulatory Visit | Attending: Obstetrics & Gynecology | Admitting: Obstetrics & Gynecology

## 2021-07-13 ENCOUNTER — Encounter (HOSPITAL_BASED_OUTPATIENT_CLINIC_OR_DEPARTMENT_OTHER): Payer: Self-pay | Admitting: Obstetrics & Gynecology

## 2021-07-13 DIAGNOSIS — R1909 Other intra-abdominal and pelvic swelling, mass and lump: Secondary | ICD-10-CM

## 2021-07-13 DIAGNOSIS — N938 Other specified abnormal uterine and vaginal bleeding: Secondary | ICD-10-CM

## 2021-07-13 DIAGNOSIS — N888 Other specified noninflammatory disorders of cervix uteri: Secondary | ICD-10-CM | POA: Insufficient documentation

## 2021-07-13 DIAGNOSIS — D270 Benign neoplasm of right ovary: Secondary | ICD-10-CM | POA: Diagnosis not present

## 2021-07-13 DIAGNOSIS — N736 Female pelvic peritoneal adhesions (postinfective): Secondary | ICD-10-CM | POA: Insufficient documentation

## 2021-07-13 DIAGNOSIS — N7011 Chronic salpingitis: Secondary | ICD-10-CM | POA: Insufficient documentation

## 2021-07-13 DIAGNOSIS — N80399 Endometriosis of the pelvic peritoneum, other specified sites, unspecified depth: Secondary | ICD-10-CM

## 2021-07-13 DIAGNOSIS — N72 Inflammatory disease of cervix uteri: Secondary | ICD-10-CM | POA: Diagnosis not present

## 2021-07-13 DIAGNOSIS — Z6841 Body Mass Index (BMI) 40.0 and over, adult: Secondary | ICD-10-CM | POA: Insufficient documentation

## 2021-07-13 DIAGNOSIS — D251 Intramural leiomyoma of uterus: Secondary | ICD-10-CM

## 2021-07-13 DIAGNOSIS — D219 Benign neoplasm of connective and other soft tissue, unspecified: Secondary | ICD-10-CM

## 2021-07-13 DIAGNOSIS — Z9889 Other specified postprocedural states: Secondary | ICD-10-CM | POA: Diagnosis present

## 2021-07-13 DIAGNOSIS — D271 Benign neoplasm of left ovary: Secondary | ICD-10-CM | POA: Insufficient documentation

## 2021-07-13 DIAGNOSIS — N83291 Other ovarian cyst, right side: Secondary | ICD-10-CM | POA: Diagnosis not present

## 2021-07-13 DIAGNOSIS — N83292 Other ovarian cyst, left side: Secondary | ICD-10-CM | POA: Diagnosis not present

## 2021-07-13 DIAGNOSIS — N809 Endometriosis, unspecified: Secondary | ICD-10-CM

## 2021-07-13 DIAGNOSIS — D250 Submucous leiomyoma of uterus: Secondary | ICD-10-CM

## 2021-07-13 DIAGNOSIS — D259 Leiomyoma of uterus, unspecified: Secondary | ICD-10-CM

## 2021-07-13 DIAGNOSIS — N83201 Unspecified ovarian cyst, right side: Secondary | ICD-10-CM

## 2021-07-13 HISTORY — DX: Presence of spectacles and contact lenses: Z97.3

## 2021-07-13 HISTORY — DX: Family history of other specified conditions: Z84.89

## 2021-07-13 HISTORY — DX: Anemia, unspecified: D64.9

## 2021-07-13 HISTORY — PX: ROBOTIC ASSISTED TOTAL HYSTERECTOMY WITH BILATERAL SALPINGO OOPHERECTOMY: SHX6086

## 2021-07-13 LAB — TYPE AND SCREEN
ABO/RH(D): B POS
Antibody Screen: NEGATIVE

## 2021-07-13 LAB — ABO/RH: ABO/RH(D): B POS

## 2021-07-13 LAB — POCT PREGNANCY, URINE: Preg Test, Ur: NEGATIVE

## 2021-07-13 LAB — SARS CORONAVIRUS 2 (TAT 6-24 HRS): SARS Coronavirus 2: NEGATIVE

## 2021-07-13 SURGERY — HYSTERECTOMY, TOTAL, ROBOT-ASSISTED, LAPAROSCOPIC, WITH BILATERAL SALPINGO-OOPHORECTOMY
Anesthesia: General | Site: Abdomen | Laterality: Bilateral

## 2021-07-13 MED ORDER — ONDANSETRON HCL 4 MG/2ML IJ SOLN: 4.0000 mg | Freq: Four times a day (QID) | INTRAMUSCULAR | Status: DC | PRN

## 2021-07-13 MED ORDER — KETOROLAC TROMETHAMINE 30 MG/ML IJ SOLN
INTRAMUSCULAR | Status: DC | PRN
  Administered 2021-07-13: 30 mg via INTRAVENOUS

## 2021-07-13 MED ORDER — IRBESARTAN 150 MG PO TABS
150.0000 mg | ORAL_TABLET | Freq: Every day | ORAL | Status: DC
  Administered 2021-07-13: 150 mg via ORAL
  Filled 2021-07-13: qty 1

## 2021-07-13 MED ORDER — HYDROMORPHONE HCL 1 MG/ML IJ SOLN
INTRAMUSCULAR | Status: AC
  Filled 2021-07-13: qty 1

## 2021-07-13 MED ORDER — PANTOPRAZOLE SODIUM 40 MG PO TBEC
40.0000 mg | DELAYED_RELEASE_TABLET | Freq: Every day | ORAL | Status: DC
  Administered 2021-07-13: 40 mg via ORAL

## 2021-07-13 MED ORDER — PROPOFOL 10 MG/ML IV BOLUS
INTRAVENOUS | Status: DC | PRN
  Administered 2021-07-13: 200 mg via INTRAVENOUS

## 2021-07-13 MED ORDER — DOCUSATE SODIUM 100 MG PO CAPS
100.0000 mg | ORAL_CAPSULE | Freq: Two times a day (BID) | ORAL | Status: DC
  Administered 2021-07-13: 100 mg via ORAL

## 2021-07-13 MED ORDER — BUPIVACAINE HCL (PF) 0.5 % IJ SOLN
INTRAMUSCULAR | Status: DC | PRN
  Administered 2021-07-13: 30 mL

## 2021-07-13 MED ORDER — FENTANYL CITRATE (PF) 100 MCG/2ML IJ SOLN
INTRAMUSCULAR | Status: DC | PRN
  Administered 2021-07-13: 50 ug via INTRAVENOUS
  Administered 2021-07-13: 100 ug via INTRAVENOUS

## 2021-07-13 MED ORDER — LACTATED RINGERS IV SOLN: INTRAVENOUS | Status: DC

## 2021-07-13 MED ORDER — BISACODYL 10 MG RE SUPP: 10.0000 mg | Freq: Every day | RECTAL | Status: DC | PRN

## 2021-07-13 MED ORDER — ONDANSETRON HCL 4 MG/2ML IJ SOLN
INTRAMUSCULAR | Status: DC | PRN
  Administered 2021-07-13: 4 mg via INTRAVENOUS

## 2021-07-13 MED ORDER — SUGAMMADEX SODIUM 200 MG/2ML IV SOLN
INTRAVENOUS | Status: DC | PRN
  Administered 2021-07-13: 260 mg via INTRAVENOUS

## 2021-07-13 MED ORDER — PANTOPRAZOLE SODIUM 40 MG PO TBEC
DELAYED_RELEASE_TABLET | ORAL | Status: AC
  Filled 2021-07-13: qty 1

## 2021-07-13 MED ORDER — MENTHOL 3 MG MT LOZG: 1.0000 | LOZENGE | OROMUCOSAL | Status: DC | PRN

## 2021-07-13 MED ORDER — DEXAMETHASONE SODIUM PHOSPHATE 4 MG/ML IJ SOLN
INTRAMUSCULAR | Status: DC | PRN
  Administered 2021-07-13: 8 mg via INTRAVENOUS

## 2021-07-13 MED ORDER — ACETAMINOPHEN 500 MG PO TABS
1000.0000 mg | ORAL_TABLET | ORAL | Status: AC
  Administered 2021-07-13: 1000 mg via ORAL

## 2021-07-13 MED ORDER — ACETAMINOPHEN 500 MG PO TABS
ORAL_TABLET | ORAL | Status: AC
  Filled 2021-07-13: qty 2

## 2021-07-13 MED ORDER — IBUPROFEN 800 MG PO TABS: 800.0000 mg | ORAL_TABLET | Freq: Three times a day (TID) | ORAL | 0 refills | Status: AC | PRN

## 2021-07-13 MED ORDER — LIDOCAINE HCL (PF) 2 % IJ SOLN
INTRAMUSCULAR | Status: DC | PRN
  Administered 2021-07-13: 1.5 mg/kg/h via INTRADERMAL

## 2021-07-13 MED ORDER — HYDROMORPHONE HCL 1 MG/ML IJ SOLN
0.2000 mg | INTRAMUSCULAR | Status: DC | PRN
  Administered 2021-07-13: 0.5 mg via INTRAVENOUS

## 2021-07-13 MED ORDER — SODIUM CHLORIDE 0.9 % IV SOLN
INTRAVENOUS | Status: AC
  Filled 2021-07-13: qty 2

## 2021-07-13 MED ORDER — VALSARTAN-HYDROCHLOROTHIAZIDE 160-25 MG PO TABS: 1.0000 | ORAL_TABLET | Freq: Every day | ORAL | Status: DC

## 2021-07-13 MED ORDER — OXYCODONE-ACETAMINOPHEN 5-325 MG PO TABS: 1.0000 | ORAL_TABLET | Freq: Four times a day (QID) | ORAL | 0 refills | Status: DC | PRN

## 2021-07-13 MED ORDER — SIMETHICONE 80 MG PO CHEW
80.0000 mg | CHEWABLE_TABLET | Freq: Four times a day (QID) | ORAL | Status: DC | PRN
  Administered 2021-07-13: 80 mg via ORAL

## 2021-07-13 MED ORDER — POVIDONE-IODINE 10 % EX SWAB: 2.0000 "application " | Freq: Once | CUTANEOUS | Status: DC

## 2021-07-13 MED ORDER — SIMETHICONE 80 MG PO CHEW
CHEWABLE_TABLET | ORAL | Status: AC
  Filled 2021-07-13: qty 1

## 2021-07-13 MED ORDER — ROCURONIUM BROMIDE 100 MG/10ML IV SOLN
INTRAVENOUS | Status: DC | PRN
  Administered 2021-07-13: 20 mg via INTRAVENOUS
  Administered 2021-07-13: 60 mg via INTRAVENOUS

## 2021-07-13 MED ORDER — GLYCOPYRROLATE 0.2 MG/ML IJ SOLN
INTRAMUSCULAR | Status: DC | PRN
  Administered 2021-07-13: .1 mg via INTRAVENOUS

## 2021-07-13 MED ORDER — MIDAZOLAM HCL 2 MG/2ML IJ SOLN
INTRAMUSCULAR | Status: DC | PRN
  Administered 2021-07-13: 2 mg via INTRAVENOUS

## 2021-07-13 MED ORDER — HYDROCHLOROTHIAZIDE 25 MG PO TABS
25.0000 mg | ORAL_TABLET | Freq: Every day | ORAL | Status: DC
  Administered 2021-07-13: 25 mg via ORAL
  Filled 2021-07-13: qty 1

## 2021-07-13 MED ORDER — SODIUM CHLORIDE 0.9 % IR SOLN
Status: DC | PRN
  Administered 2021-07-13: 600 mL
  Administered 2021-07-13: 200 mL via INTRAVESICAL

## 2021-07-13 MED ORDER — FENTANYL CITRATE (PF) 250 MCG/5ML IJ SOLN
INTRAMUSCULAR | Status: AC
  Filled 2021-07-13: qty 5

## 2021-07-13 MED ORDER — HEMOSTATIC AGENTS (NO CHARGE) OPTIME
TOPICAL | Status: DC | PRN
Start: 2021-07-13 — End: 2021-07-13
  Administered 2021-07-13: 1 via TOPICAL

## 2021-07-13 MED ORDER — LIDOCAINE HCL (CARDIAC) PF 100 MG/5ML IV SOSY
PREFILLED_SYRINGE | INTRAVENOUS | Status: DC | PRN
  Administered 2021-07-13: 80 mg via INTRAVENOUS

## 2021-07-13 MED ORDER — KETOROLAC TROMETHAMINE 30 MG/ML IJ SOLN
INTRAMUSCULAR | Status: AC
  Filled 2021-07-13: qty 1

## 2021-07-13 MED ORDER — DEXTROSE-NACL 5-0.45 % IV SOLN: INTRAVENOUS | Status: DC

## 2021-07-13 MED ORDER — FENTANYL CITRATE (PF) 100 MCG/2ML IJ SOLN
25.0000 ug | INTRAMUSCULAR | Status: DC | PRN
  Administered 2021-07-13: 25 ug via INTRAVENOUS

## 2021-07-13 MED ORDER — SODIUM CHLORIDE 0.9 % IV SOLN
2.0000 g | INTRAVENOUS | Status: AC
  Administered 2021-07-13: 2 g via INTRAVENOUS

## 2021-07-13 MED ORDER — PROPOFOL 10 MG/ML IV BOLUS
INTRAVENOUS | Status: AC
  Filled 2021-07-13: qty 20

## 2021-07-13 MED ORDER — DOCUSATE SODIUM 100 MG PO CAPS
ORAL_CAPSULE | ORAL | Status: AC
  Filled 2021-07-13: qty 1

## 2021-07-13 MED ORDER — MIDAZOLAM HCL 2 MG/2ML IJ SOLN
INTRAMUSCULAR | Status: AC
  Filled 2021-07-13: qty 2

## 2021-07-13 MED ORDER — OXYCODONE-ACETAMINOPHEN 5-325 MG PO TABS
1.0000 | ORAL_TABLET | ORAL | Status: DC | PRN
  Administered 2021-07-13: 1 via ORAL

## 2021-07-13 MED ORDER — IBUPROFEN 200 MG PO TABS: 600.0000 mg | ORAL_TABLET | Freq: Four times a day (QID) | ORAL | Status: DC

## 2021-07-13 MED ORDER — DEXMEDETOMIDINE (PRECEDEX) IN NS 20 MCG/5ML (4 MCG/ML) IV SYRINGE
PREFILLED_SYRINGE | INTRAVENOUS | Status: DC | PRN
  Administered 2021-07-13: 12 ug via INTRAVENOUS

## 2021-07-13 MED ORDER — SOD CITRATE-CITRIC ACID 500-334 MG/5ML PO SOLN: 30.0000 mL | ORAL | Status: DC

## 2021-07-13 MED ORDER — FENTANYL CITRATE (PF) 100 MCG/2ML IJ SOLN
INTRAMUSCULAR | Status: AC
  Filled 2021-07-13: qty 2

## 2021-07-13 MED ORDER — ONDANSETRON HCL 4 MG PO TABS: 4.0000 mg | ORAL_TABLET | Freq: Four times a day (QID) | ORAL | Status: DC | PRN

## 2021-07-13 MED ORDER — ZOLPIDEM TARTRATE 5 MG PO TABS: 5.0000 mg | ORAL_TABLET | Freq: Every evening | ORAL | Status: DC | PRN

## 2021-07-13 MED ORDER — OXYCODONE-ACETAMINOPHEN 5-325 MG PO TABS
ORAL_TABLET | ORAL | Status: AC
  Filled 2021-07-13: qty 1

## 2021-07-13 MED ORDER — KETOROLAC TROMETHAMINE 30 MG/ML IJ SOLN
30.0000 mg | Freq: Four times a day (QID) | INTRAMUSCULAR | Status: DC
  Administered 2021-07-13: 30 mg via INTRAVENOUS

## 2021-07-13 MED ORDER — POLYETHYLENE GLYCOL 3350 17 G PO PACK: 17.0000 g | PACK | Freq: Every day | ORAL | Status: DC | PRN

## 2021-07-13 MED ORDER — OXYCODONE-ACETAMINOPHEN 5-325 MG PO TABS: 1.0000 | ORAL_TABLET | Freq: Four times a day (QID) | ORAL | 0 refills | Status: AC | PRN

## 2021-07-13 SURGICAL SUPPLY — 77 items
ADH SKN CLS APL DERMABOND .7 (GAUZE/BANDAGES/DRESSINGS) ×1
APL SRG 38 LTWT LNG FL B (MISCELLANEOUS) ×1
APPLICATOR ARISTA FLEXITIP XL (MISCELLANEOUS) ×2 IMPLANT
APPLIER CLIP 5 13 M/L LIGAMAX5 (MISCELLANEOUS)
APR CLP MED LRG 5 ANG JAW (MISCELLANEOUS)
BARRIER ADHS 3X4 INTERCEED (GAUZE/BANDAGES/DRESSINGS) IMPLANT
BRR ADH 4X3 ABS CNTRL BYND (GAUZE/BANDAGES/DRESSINGS)
CATH FOLEY 3WAY  5CC 16FR (CATHETERS) ×1
CATH FOLEY 3WAY 5CC 16FR (CATHETERS) ×1 IMPLANT
CLIP APPLIE 5 13 M/L LIGAMAX5 (MISCELLANEOUS) IMPLANT
COVER BACK TABLE 60X90IN (DRAPES) ×2 IMPLANT
COVER TIP SHEARS 8 DVNC (MISCELLANEOUS) ×1 IMPLANT
COVER TIP SHEARS 8MM DA VINCI (MISCELLANEOUS) ×1
DECANTER SPIKE VIAL GLASS SM (MISCELLANEOUS) IMPLANT
DEFOGGER SCOPE WARMER CLEARIFY (MISCELLANEOUS) ×2 IMPLANT
DERMABOND ADVANCED (GAUZE/BANDAGES/DRESSINGS) ×1
DERMABOND ADVANCED .7 DNX12 (GAUZE/BANDAGES/DRESSINGS) ×1 IMPLANT
DRAPE ARM DVNC X/XI (DISPOSABLE) ×4 IMPLANT
DRAPE COLUMN DVNC XI (DISPOSABLE) ×1 IMPLANT
DRAPE DA VINCI XI ARM (DISPOSABLE) ×4
DRAPE DA VINCI XI COLUMN (DISPOSABLE) ×1
DRAPE UTILITY XL STRL (DRAPES) ×2 IMPLANT
DURAPREP 26ML APPLICATOR (WOUND CARE) ×4 IMPLANT
ELECT REM PT RETURN 9FT ADLT (ELECTROSURGICAL) ×2
ELECTRODE REM PT RTRN 9FT ADLT (ELECTROSURGICAL) ×1 IMPLANT
GAUZE 4X4 16PLY ~~LOC~~+RFID DBL (SPONGE) ×4 IMPLANT
GLOVE SURG ENC MOIS LTX SZ7 (GLOVE) ×6 IMPLANT
GLOVE SURG UNDER POLY LF SZ7 (GLOVE) ×6 IMPLANT
HEMOSTAT ARISTA ABSORB 3G PWDR (HEMOSTASIS) ×2 IMPLANT
HOLDER FOLEY CATH W/STRAP (MISCELLANEOUS) IMPLANT
IRRIG SUCT STRYKERFLOW 2 WTIP (MISCELLANEOUS) ×2
IRRIGATION SUCT STRKRFLW 2 WTP (MISCELLANEOUS) ×1 IMPLANT
IV NS 1000ML (IV SOLUTION) ×2
IV NS 1000ML BAXH (IV SOLUTION) ×1 IMPLANT
KIT TURNOVER CYSTO (KITS) ×2 IMPLANT
LEGGING LITHOTOMY PAIR STRL (DRAPES) ×2 IMPLANT
MANIPULATOR ADVINCU DEL 2.5 PL (MISCELLANEOUS) IMPLANT
MANIPULATOR ADVINCU DEL 3.0 PL (MISCELLANEOUS) ×2 IMPLANT
MANIPULATOR ADVINCU DEL 3.5 PL (MISCELLANEOUS) ×2 IMPLANT
MANIPULATOR ADVINCU DEL 4.0 PL (MISCELLANEOUS) IMPLANT
NEEDLE INSUFFLATION 120MM (ENDOMECHANICALS) ×2 IMPLANT
OBTURATOR OPTICAL STANDARD 8MM (TROCAR) ×1
OBTURATOR OPTICAL STND 8 DVNC (TROCAR) ×1
OBTURATOR OPTICALSTD 8 DVNC (TROCAR) ×1 IMPLANT
OCCLUDER COLPOPNEUMO (BALLOONS) IMPLANT
PACK ROBOT WH (CUSTOM PROCEDURE TRAY) ×2 IMPLANT
PACK ROBOTIC GOWN (GOWN DISPOSABLE) ×2 IMPLANT
PACK TRENDGUARD 450 HYBRID PRO (MISCELLANEOUS) ×1 IMPLANT
PAD OB MATERNITY 4.3X12.25 (PERSONAL CARE ITEMS) ×2 IMPLANT
PAD PREP 24X48 CUFFED NSTRL (MISCELLANEOUS) ×2 IMPLANT
PROTECTOR NERVE ULNAR (MISCELLANEOUS) IMPLANT
SCISSORS LAP 5X35 DISP (ENDOMECHANICALS) ×2 IMPLANT
SEAL CANN UNIV 5-8 DVNC XI (MISCELLANEOUS) ×3 IMPLANT
SEAL XI 5MM-8MM UNIVERSAL (MISCELLANEOUS) ×3
SEALER VESSEL DA VINCI XI (MISCELLANEOUS) ×1
SEALER VESSEL EXT DVNC XI (MISCELLANEOUS) ×1 IMPLANT
SET IRRIG Y TYPE TUR BLADDER L (SET/KITS/TRAYS/PACK) ×2 IMPLANT
SET TRI-LUMEN FLTR TB AIRSEAL (TUBING) ×2 IMPLANT
SPONGE T-LAP 4X18 ~~LOC~~+RFID (SPONGE) IMPLANT
SUT VIC AB 0 CT1 27 (SUTURE) ×4
SUT VIC AB 0 CT1 27XBRD ANBCTR (SUTURE) ×2 IMPLANT
SUT VIC AB 4-0 PS2 18 (SUTURE) ×4 IMPLANT
SUT VICRYL 0 UR6 27IN ABS (SUTURE) IMPLANT
SUT VLOC 180 0 9IN  GS21 (SUTURE) ×1
SUT VLOC 180 0 9IN GS21 (SUTURE) ×1 IMPLANT
SYSTEM CARTER THOMASON II (TROCAR) IMPLANT
TIP RUMI ORANGE 6.7MMX12CM (TIP) IMPLANT
TIP UTERINE 5.1X6CM LAV DISP (MISCELLANEOUS) IMPLANT
TIP UTERINE 6.7X10CM GRN DISP (MISCELLANEOUS) IMPLANT
TIP UTERINE 6.7X6CM WHT DISP (MISCELLANEOUS) IMPLANT
TIP UTERINE 6.7X8CM BLUE DISP (MISCELLANEOUS) IMPLANT
TOWEL OR 17X26 10 PK STRL BLUE (TOWEL DISPOSABLE) ×2 IMPLANT
TRENDGUARD 450 HYBRID PRO PACK (MISCELLANEOUS) ×2
TROCAR BLADELESS OPT 5 100 (ENDOMECHANICALS) IMPLANT
TROCAR PORT AIRSEAL 5X120 (TROCAR) ×2 IMPLANT
WATER STERILE IRR 1000ML POUR (IV SOLUTION) IMPLANT
WATER STERILE IRR 500ML POUR (IV SOLUTION) ×2 IMPLANT

## 2021-07-13 NOTE — Anesthesia Preprocedure Evaluation (Signed)
Anesthesia Evaluation  Patient identified by MRN, date of birth, ID band Patient awake    Reviewed: Allergy & Precautions, NPO status   Airway Mallampati: II  TM Distance: >3 FB     Dental   Pulmonary    breath sounds clear to auscultation       Cardiovascular hypertension,  Rhythm:Regular Rate:Normal     Neuro/Psych    GI/Hepatic negative GI ROS, Neg liver ROS,   Endo/Other  negative endocrine ROS  Renal/GU negative Renal ROS     Musculoskeletal   Abdominal   Peds  Hematology   Anesthesia Other Findings   Reproductive/Obstetrics                             Anesthesia Physical Anesthesia Plan  ASA: 3  Anesthesia Plan: General   Post-op Pain Management:    Induction: Intravenous  PONV Risk Score and Plan: 3 and Ondansetron and Midazolam  Airway Management Planned: Oral ETT  Additional Equipment:   Intra-op Plan:   Post-operative Plan: Extubation in OR  Informed Consent: I have reviewed the patients History and Physical, chart, labs and discussed the procedure including the risks, benefits and alternatives for the proposed anesthesia with the patient or authorized representative who has indicated his/her understanding and acceptance.     Dental advisory given  Plan Discussed with: CRNA and Anesthesiologist  Anesthesia Plan Comments:         Anesthesia Quick Evaluation

## 2021-07-13 NOTE — Transfer of Care (Signed)
Immediate Anesthesia Transfer of Care Note  Patient: Penny Yu  Procedure(s) Performed: XI ROBOTIC ASSISTED TOTAL HYSTERECTOMY WITH BILATERAL SALPINGO OOPHORECTOMY (Bilateral: Abdomen)  Patient Location: PACU  Anesthesia Type:General  Level of Consciousness: drowsy and patient cooperative  Airway & Oxygen Therapy: Patient Spontanous Breathing and Patient connected to face mask oxygen  Post-op Assessment: Report given to RN and Post -op Vital signs reviewed and stable  Post vital signs: Reviewed and stable  Last Vitals:  Vitals Value Taken Time  BP 153/79 07/13/21 1102  Temp    Pulse 75 07/13/21 1104  Resp 18 07/13/21 1104  SpO2 100 % 07/13/21 1104  Vitals shown include unvalidated device data.  Last Pain:  Vitals:   07/13/21 0724  TempSrc: Oral      Patients Stated Pain Goal: 5 (41/96/22 2979)  Complications: No notable events documented.

## 2021-07-13 NOTE — Anesthesia Postprocedure Evaluation (Signed)
Anesthesia Post Note  Patient: [name]  Procedure(s) Performed: XI ROBOTIC ASSISTED TOTAL HYSTERECTOMY WITH BILATERAL SALPINGO OOPHORECTOMY (Bilateral: Abdomen)     Patient location during evaluation: PACU Anesthesia Type: General Pain management: pain level controlled Vital Signs Assessment: post-procedure vital signs reviewed and stable Respiratory status: spontaneous breathing Cardiovascular status: stable Postop Assessment: no apparent nausea or vomiting Anesthetic complications: no   No notable events documented.  Last Vitals:  Vitals:   07/13/21 1304 07/13/21 1415  BP: (!) 173/117 135/74  Pulse: 63 67  Resp: 14 16  Temp: 36.4 C 36.7 C  SpO2: 97% 97%    Last Pain:  Vitals:   07/13/21 1415  TempSrc:   PainSc: 3                  [name]

## 2021-07-13 NOTE — Anesthesia Procedure Notes (Signed)
Procedure Name: Intubation Date/Time: 07/13/2021 9:00 AM Performed by: Georgeanne Nim, CRNA Pre-anesthesia Checklist: Patient identified, Emergency Drugs available, Suction available, Patient being monitored and Timeout performed Patient Re-evaluated:Patient Re-evaluated prior to induction Oxygen Delivery Method: Circle system utilized Preoxygenation: Pre-oxygenation with 100% oxygen Induction Type: IV induction Ventilation: Mask ventilation without difficulty Laryngoscope Size: Mac and 4 Grade View: Grade I Tube type: Oral Tube size: 7.0 mm Number of attempts: 1 Placement Confirmation: ETT inserted through vocal cords under direct vision, positive ETCO2, CO2 detector and breath sounds checked- equal and bilateral Secured at: 21 cm Tube secured with: Tape Dental Injury: Teeth and Oropharynx as per pre-operative assessment

## 2021-07-13 NOTE — H&P (Signed)
Preoperative History and Physical  Penny Yu is a 45 y.o. G1P0 here for surgical management of AUB, fibroids, bilateral ovarian cysts. Pt has a h/o endometiosis dx'd by laparoscopy.    Proposed surgery: Robot assisted total laparoscopic hysterectomy with bilateral salpingo-oophorectomy  Past Medical History:  Diagnosis Date   Anemia    Anxiety    Depression    Endometriosis determined by laparoscopy    Family history of adverse reaction to anesthesia    mother slow to awaken morethan 5 yrs ago due to guilliane barre per pt on 07-06-2021   Headache(784.0)    Hypertension    with current pregnancy   Morbid obesity ([place])    Wears glasses    Past Surgical History:  Procedure Laterality Date   CESAREAN SECTION  2011   DIAGNOSTIC LAPAROSCOPY  2008   Endometriosis diagnosed   DILATATION & CURRETTAGE/HYSTEROSCOPY WITH RESECTOCOPE N/A 05/03/2013   Procedure: DILATATION & CURETTAGE/HYSTEROSCOPY WITH RESECTOCOPE;  Surgeon: Marvene Staff, MD;  Location: [facility];  Service: Gynecology;  Laterality: N/A;   OB History     Gravida  1   Para      Term      Preterm      AB      Living  1      SAB      IAB      Ectopic      Multiple      Live Births  1          Patient denies any cervical dysplasia or STIs. Medications Prior to Admission  Medication Sig Dispense Refill Last Dose   calcium-vitamin D (OSCAL WITH D) 500-200 MG-UNIT tablet Take 1 tablet by mouth.   07/12/2021   ferrous sulfate 325 (65 FE) MG tablet Take 325 mg by mouth daily with breakfast.   07/12/2021   Multiple Vitamin (MULTIVITAMIN WITH MINERALS) TABS tablet Take 1 tablet by mouth daily.   07/12/2021   OVER THE COUNTER MEDICATION Vitmain d 3 25 mcg daily   07/12/2021   Sennosides (EX-LAX PO) Take by mouth as needed.   Past Week   valsartan-hydrochlorothiazide (DIOVAN-HCT) 160-25 MG tablet Take 1 tablet by mouth daily.   07/12/2021   vitamin B-12 (CYANOCOBALAMIN) 500 MCG tablet Take 500 mcg by mouth  daily.   07/12/2021   vitamin C (ASCORBIC ACID) 250 MG tablet Take 250 mg by mouth daily.   07/12/2021   Wheat Dextrin (BENEFIBER PO) Take by mouth. 1 or 2 qday   07/12/2021    No Known Allergies Social History:   reports that she has never smoked. She has never used smokeless tobacco. She reports current alcohol use. She reports that she does not use drugs. Family History  Problem Relation Age of Onset   Breast cancer Paternal Aunt    Breast cancer Paternal Grandmother     Review of Systems: Noncontributory  PHYSICAL EXAM: Blood pressure (!) 160/99, pulse 83, temperature 98.7 F (37.1 C), temperature source Oral, resp. rate 18, height 5' 4.5" (1.638 m), weight 128.9 kg, last menstrual period 06/29/2021, SpO2 100 %. General appearance - alert, well appearing, and in no distress Chest - clear to auscultation, no wheezes, rales or rhonchi, symmetric air entry Heart - normal rate and regular rhythm Abdomen - soft, nontender, nondistended, no masses or organomegaly Pelvic - Normal appearing external genitalia; normal appearing vaginal mucosa and cervix.  Normal discharge.  Small uterus, mobile, adnexal fullness. Mobile. Difficult to fully assess due to body habitus.  No uterine or adnexal tenderness Extremities - peripheral pulses normal, no pedal edema, no clubbing or cyanosis  Labs: Results for orders placed or performed during the hospital encounter of 07/13/21 (from the past 336 hour(s))  Pregnancy, urine POC   Collection Time: 07/13/21  7:00 AM  Result Value Ref Range   Preg Test, Ur NEGATIVE NEGATIVE  ABO/Rh   Collection Time: 07/13/21  7:28 AM  Result Value Ref Range   ABO/RH(D) PENDING   Results for orders placed or performed during the hospital encounter of 07/09/21 (from the past 336 hour(s))  Basic metabolic panel per protocol   Collection Time: 07/09/21  8:16 AM  Result Value Ref Range   Sodium 140 135 - 145 mmol/L   Potassium 3.6 3.5 - 5.1 mmol/L   Chloride 105 98 - 111  mmol/L   CO2 28 22 - 32 mmol/L   Glucose, Bld 101 (H) 70 - 99 mg/dL   BUN 17 6 - 20 mg/dL   Creatinine, Ser 0.72 0.44 - 1.00 mg/dL   Calcium 8.9 8.9 - 10.3 mg/dL   GFR, Estimated >60 >60 mL/min   Anion gap 7 5 - 15  CBC   Collection Time: 07/09/21  8:16 AM  Result Value Ref Range   WBC 4.7 4.0 - 10.5 K/uL   RBC 4.58 3.87 - 5.11 MIL/uL   Hemoglobin 11.6 (L) 12.0 - 15.0 g/dL   HCT 36.3 36.0 - 46.0 %   MCV 79.3 (L) 80.0 - 100.0 fL   MCH 25.3 (L) 26.0 - 34.0 pg   MCHC 32.0 30.0 - 36.0 g/dL   RDW 16.6 (H) 11.5 - 15.5 %   Platelets 427 (H) 150 - 400 K/uL   nRBC 0.0 0.0 - 0.2 %  Type and screen [facility]   Collection Time: 07/09/21  8:16 AM  Result Value Ref Range   ABO/RH(D) B POS    Antibody Screen NEG    Sample Expiration 07/16/2021,2359    Extend sample reason      NO TRANSFUSIONS OR PREGNANCY IN THE PAST 3 MONTHS Performed at [facility], [address], [place], [place] [address]   Results for orders placed or performed in visit on 07/09/21 (from the past 336 hour(s))  SARS Coronavirus 2 (TAT 6-24 hrs)   Collection Time: 07/09/21 12:00 AM  Result Value Ref Range   SARS Coronavirus 2 RESULT: NEGATIVE     Imaging Studies: MM 3D SCREEN BREAST BILATERAL  Result Date: 07/11/2021 CLINICAL DATA:  Screening. EXAM: DIGITAL SCREENING BILATERAL MAMMOGRAM WITH TOMOSYNTHESIS AND CAD TECHNIQUE: Bilateral screening digital craniocaudal and mediolateral oblique mammograms were obtained. Bilateral screening digital breast tomosynthesis was performed. The images were evaluated with computer-aided detection. COMPARISON:  Previous exam(s). ACR Breast Density Category a: The breast tissue is almost entirely fatty. FINDINGS: There are no findings suspicious for malignancy. IMPRESSION: No mammographic evidence of malignancy. A result letter of this screening mammogram will be mailed directly to the patient. RECOMMENDATION: Screening mammogram in one year.  (Code:SM-B-01Y) BI-RADS CATEGORY  1: Negative. Electronically Signed   By: Ammie Ferrier M.D.   On: 07/11/2021 08:39    04/26/2021 CLINICAL DATA:  Irregular menstrual bleeding, history of Caesarean section, D&C, hysteroscopy, past history endometriosis; LMP 03/24/2021   EXAM: TRANSABDOMINAL AND TRANSVAGINAL ULTRASOUND OF PELVIS   TECHNIQUE: Both transabdominal and transvaginal ultrasound examinations of the pelvis were performed. Transabdominal technique was performed for global imaging of the pelvis including uterus, ovaries, adnexal regions, and pelvic cul-de-sac.  It was necessary to proceed with endovaginal exam following the transabdominal exam to visualize the uterus, endometrium, and to characterize a cystic LEFT ovarian lesion.   COMPARISON:  None   FINDINGS: Uterus   Measurements: 7.6 x 5.8 x 6.0 cm = volume: 137 mL. Retroverted. Normal morphology without mass   Endometrium   Thickness: 6 mm.  No endometrial fluid focal abnormality   Right ovary   Measurements: 6.9 x 4.2 x 5.6 cm = volume: 83 mL. Complex cystic mass within RIGHT ovary 4.2 x 4.5 x 4.6 cm containing multiple loculations and septations as well as scattered internal echogenicity, concerning for an ovarian neoplasm   Left ovary   Measurements: 9.5 x 6.5 x 7.5 cm = volume: 241 mL. Complicated cystic lesion 7.4 x 5.5 x 6.8 cm within LEFT ovary containing thin septations and internal daughter cyst. No discrete mural nodularity. Blood flow is detected within a thin septations.   Other findings   No free pelvic fluid.  No additional adnexal masses.   IMPRESSION: Unremarkable uterus and endometrial complex.   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   These results will be called to the ordering clinician or representative by the Radiologist Assistant, and  communication documented in the PACS or [employer].   Assessment: Patient Active Problem List   Diagnosis Date Noted   Fibroids 07/13/2021   Bilateral ovarian cysts 07/13/2021   Endometriosis determined by laparoscopy 07/13/2021    Plan: Patient will undergo surgical management with Robot assisted total laparoscopic hysterectomy with bilateral salpingo-oophorectomy.   The risks of surgery were discussed in detail with the patient including but not limited to: bleeding which may require transfusion or reoperation; infection which may require antibiotics; injury to surrounding organs which may involve bowel, bladder, ureters ; need for additional procedures including laparoscopy or laparotomy; thromboembolic phenomenon, surgical site problems and other postoperative/anesthesia complications. Likelihood of success in alleviating the patient's condition was discussed. Routine postoperative instructions will be reviewed with the patient and her family in detail after surgery.  The patient concurred with the proposed plan, giving informed written consent for the surgery.  Patient has been NPO since last night she will remain NPO for procedure.  Anesthesia and OR aware.  Preoperative prophylactic antibiotics and SCDs ordered on call to the OR.  To OR when ready.  Breon Rehm L. Harraway-Smith, M.D., [facility] 07/13/2021 8:15 AM

## 2021-07-13 NOTE — Brief Op Note (Signed)
07/13/2021  11:03 AM  PATIENT:  Penny Yu  45 y.o. female  PRE-OPERATIVE DIAGNOSIS:  AUB, Adnexal mass, Ovarian cysts, Endometriosis  POST-OPERATIVE DIAGNOSIS:  AUB, Adnexal mass, Ovarian cysts, Endometriosis  PROCEDURE:  Procedure(s): XI ROBOTIC ASSISTED TOTAL HYSTERECTOMY WITH BILATERAL SALPINGO OOPHORECTOMY (Bilateral)  SURGEON:  Surgeon(s) and Role:    * Lavonia Drafts, MD - Primary    * Megan Salon, MD - Assisting  ANESTHESIA:   local and general  EBL:  100 mL   BLOOD ADMINISTERED:none  DRAINS: none   LOCAL MEDICATIONS USED:  MARCAINE     SPECIMEN:  Source of Specimen:  uterus with cervix and bilateral ovaries and fallopian tubes.   DISPOSITION OF SPECIMEN:  PATHOLOGY  COUNTS:  YES  TOURNIQUET:  * No tourniquets in log *  DICTATION: .Note written in [place]:  Prolonged observation followed by discharge  PATIENT DISPOSITION:  PACU - hemodynamically stable.   Delay start of Pharmacological VTE agent (>24hrs) due to surgical blood loss or risk of bleeding: no  Complications: none immediate.   Aurea Aronov L. Harraway-Smith, M.D., [facility]

## 2021-07-13 NOTE — Op Note (Signed)
07/13/2021  11:03 AM  PATIENT:  Penny Yu  45 y.o. female  PRE-OPERATIVE DIAGNOSIS:  AUB, Adnexal mass, Ovarian cysts, Endometriosis  POST-OPERATIVE DIAGNOSIS:  AUB, Adnexal mass, Ovarian cysts, Endometriosis  PROCEDURE:  Procedure(s): XI ROBOTIC ASSISTED TOTAL HYSTERECTOMY WITH BILATERAL SALPINGO OOPHORECTOMY (Bilateral)  SURGEON:  Surgeon(s) and Role:    * Lavonia Drafts, MD - Primary    * Megan Salon, MD - Assisting  ANESTHESIA:   local and general  EBL:  100 mL   BLOOD ADMINISTERED:none  DRAINS: none   LOCAL MEDICATIONS USED:  MARCAINE     SPECIMEN:  Source of Specimen:  uterus with cervix and bilateral ovaries and fallopian tubes.   DISPOSITION OF SPECIMEN:  PATHOLOGY  COUNTS:  YES  TOURNIQUET:  * No tourniquets in log *  DICTATION: .Note written in [place]: Prolonged observation followed by discharge  PATIENT DISPOSITION:  PACU - hemodynamically stable.   Delay start of Pharmacological VTE agent (>24hrs) due to surgical blood loss or risk of bleeding: no  Complications: none immediate.   Findings: normal uterus and fallopian tubes. There were bilateral ovarian cysts. The omentum was adherent to the anterior abdominal wall. In the midline near the umbilicus.   Procedure:  The risks, benefits, and alternatives of surgery were explained, understood, and accepted. Consents were signed. All questions were answered. She was taken to the operating room and general anesthesia was applied without complication. She was placed in the dorsal lithotomy position and her abdomen and vagina were prepped and draped after she had been carefully positioned on the table. A bimanual exam revealed a 10 week sized uterus that was mobile. Her adnexa were not enlarged. The cervix was measured and the uterus was sounded to 10 cm. A Advincula  uterine manipulator was placed without difficulty. A Foley catheter was placed and it drained clear throughout the case.  Gloves were changed and attention was turned to the abdomen. A 61m incision was made in the left upper quadrant and an Optiview trocar was inserted. Laparoscopy confirmed correct placement. CO2 was used to insufflate the abdomen to approximately 4 L. After good pneumoperitoneum was established, two 8 mm trocars were placed  were placed in appropriate positions on her abdomen to allow maximum exposure during the robotic case. There was also a midline 859mport that was placed 6cm above the umbilicus. These were all placed under direct laparoscopic visualization. The robot was docked and I proceeded with a robotic portion of the case.  The pelvis was inspected and the uterus was found to have fibroids and be slightly enlarged.  The fallopian tubes appeared normal but, there were cysts noted in each of the ovaries. The remainder of her pelvis appeared normal with the exception of the adhesions of omentum to the anterior abdominal wall.  The omentum was inspected and found to be free of bowel. The omentum was then released sharply. The ureters and the infundibulopelvic ligaments were identified. The infundibulopelvic ligament was cauterized and cut. The Vessel Sealer instrument was used for this portion.  The round ligament on each side was identified, cauterized and ligated, a bladder flap was created anteriorly. The uterine vessels were identified and cauterized and then cut.The bladder was pushed out of the operative site and an anterior colpotomy was made. The colpotomy incision was extended circumferentially, following the blue outline of the Rumi manipulator. All pedicles were hemostatic.  The uterus was removed from the vagina with the fallopian tube segments and ovary  with cysts. The vaginal cuff was closed with 0 vicryl v-lock suture.  Excellent hemostasis was noted throughout. The pelvis was irrigated. The intraabdominal pressure was lowered assess hemostasis. At this point I performed cystoscopy. The  cystoscopy revealed blue ejection from both ureters. Arista was applied to the vaginal cuff. After determining excellent hemostasis, the robot was undocked.  The skin from all of the ports was closed with 3-0 vicryl. 30cc of 0.5% Marcaine was injected into the port sites. Dermabond was applied. The patient was then extubated and taken to recovery in stable condition.   Sponge, lap and needle counts were correct x 2.  An experienced assistant was required given the standard of surgical care given the complexity of the case.  This assistant was needed for exposure, dissection, suctioning, retraction, instrument exchange, and for overall help during the procedure.  Lourine Alberico L. Harraway-Smith, M.D., [facility]

## 2021-07-14 ENCOUNTER — Encounter (HOSPITAL_BASED_OUTPATIENT_CLINIC_OR_DEPARTMENT_OTHER): Payer: Self-pay | Admitting: Obstetrics & Gynecology

## 2021-07-14 LAB — SURGICAL PATHOLOGY

## 2021-07-28 ENCOUNTER — Ambulatory Visit (INDEPENDENT_AMBULATORY_CARE_PROVIDER_SITE_OTHER): Payer: Medicaid Other | Admitting: Obstetrics & Gynecology

## 2021-07-28 ENCOUNTER — Other Ambulatory Visit: Payer: Self-pay

## 2021-07-28 ENCOUNTER — Encounter: Payer: Self-pay | Admitting: Obstetrics & Gynecology

## 2021-07-28 VITALS — BP 106/67 | HR 84 | Wt 276.0 lb

## 2021-07-28 DIAGNOSIS — Z9889 Other specified postprocedural states: Secondary | ICD-10-CM

## 2021-07-28 NOTE — Progress Notes (Signed)
History:  45 y.o.LMP here today for 2 week post op check.Pt is s/p XI ROBOTIC ASSISTED TOTAL HYSTERECTOMY WITH BILATERAL SALPINGO OOPHORECTOMY (Bilateral) on 07/13/2021.  Pt reports that she is doing well. She is eating and passing stools without difficulty.   Pt denies hot flushes.    The following portions of the patient's history were reviewed and updated as appropriate: allergies, current medications, past family history, past medical history, past social history, past surgical history and problem list.  Review of Systems:  Pertinent items are noted in HPI.    Objective:  Physical Exam Wt 276 lb (125.2 kg)   LMP 06/29/2021 Comment: Pt stated she will be having a hysterectomy next Tuesday.  BMI 46.64 kg/m   CONSTITUTIONAL: Well-developed, well-nourished female in no acute distress.  HENT:  Normocephalic, atraumatic EYES: Conjunctivae and EOM are normal. No scleral icterus.  NECK: Normal range of motion SKIN: Skin is warm and dry. No rash noted. Not diaphoretic.No pallor. [place]: Alert and oriented to person, place, and time. Normal coordination.  Abd: Soft, nontender and nondistended; her port sites are healing well. .  Pelvic: deferred  Labs and Imaging Surg path 07/13/2021 Clinical History: AUB, adnexal mass, ovarian cysts, endometriosis ([facility])   FINAL MICROSCOPIC DIAGNOSIS:   A. UTERUS, CERVIX, BILATERAL FALLOPIAN TUBES AND OVARIE- Cervix      Nabothian cyst with slight cervicitis and squamous metaplasia.      No dysplasia or malignancy.  - Endometrium      Proliferative with breakdown.      No hyperplasia or malignancy.  - Myometrium      Unremarkable.      No evidence of malignancy.  - Uterine serosa      Fibrous adhesions with endosalpingosis and calcifications.      No evidence of malignancy.  - Right ovary      Cystadenofibroma with focal calcifications.      No borderline change or malignancy.  - Left ovary      Cystadenofibroma with focal calcifications.       No borderline change or malignancy.  - Right Fallopian tube      Tubo-ovarian adhesions.      No endometriosis or malignancy.  - Left Fallopian tube      Tubo-ovarian adhesions and mild hydrosalpinx.      No endometriosis or malignancy.    Assessment & Plan:  2 week post op check following RATH with BSO  Doing well  Reviewed her surg path.   Reviewed post op instructions and activities  Gradual increase in activities  F/u in 4 weeks or sooner prn  Reviewed no intercourse for 8 weeks post op  All questions answered.   Penny Yu, M.D., [facility]

## 2021-08-23 ENCOUNTER — Other Ambulatory Visit: Payer: Self-pay

## 2021-08-23 ENCOUNTER — Ambulatory Visit (INDEPENDENT_AMBULATORY_CARE_PROVIDER_SITE_OTHER): Payer: Medicaid Other | Admitting: Obstetrics & Gynecology

## 2021-08-23 ENCOUNTER — Encounter: Payer: Self-pay | Admitting: Obstetrics & Gynecology

## 2021-08-23 VITALS — BP 92/65 | HR 85 | Wt 272.0 lb

## 2021-08-23 DIAGNOSIS — D500 Iron deficiency anemia secondary to blood loss (chronic): Secondary | ICD-10-CM

## 2021-08-23 DIAGNOSIS — Z9889 Other specified postprocedural states: Secondary | ICD-10-CM

## 2021-08-23 NOTE — Progress Notes (Signed)
History:  45 y.o.LMP here today for 2 week post op check.Pt is s/p RATH with bilateral salpingectomy and ovaries on 07/13/2021.  Pt reports that she is doing well. She is eating and passing stools without difficulty.   She reports mild hot flushes that are bearable.   The following portions of the patient's history were reviewed and updated as appropriate: allergies, current medications, past family history, past medical history, past social history, past surgical history and problem list.  Review of Systems:  Pertinent items are noted in HPI.    Objective:  Physical Exam BP 92/65   Pulse 85   Wt 272 lb (123.4 kg)   LMP 06/29/2021 Comment: Pt stated she will be having a hysterectomy next Tuesday.  BMI 45.97 kg/m   CONSTITUTIONAL: Well-developed, well-nourished female in no acute distress.  HENT:  Normocephalic, atraumatic EYES: Conjunctivae and EOM are normal. No scleral icterus.  NECK: Normal range of motion SKIN: Skin is warm and dry. No rash noted. Not diaphoretic.No pallor. [place]: Alert and oriented to person, place, and time. Normal coordination.  Abd: Soft, nontender and nondistended; her port sites are healing well. .  Pelvic: no blood in vault. Vaginal cuff well healed.   Labs and Imaging Surg path 07/13/2021 FINAL MICROSCOPIC DIAGNOSIS:   A. UTERUS, CERVIX, BILATERAL FALLOPIAN TUBES AND OVARIE- Cervix      Nabothian cyst with slight cervicitis and squamous metaplasia.      No dysplasia or malignancy.  - Endometrium      Proliferative with breakdown.      No hyperplasia or malignancy.  - Myometrium      Unremarkable.      No evidence of malignancy.  - Uterine serosa      Fibrous adhesions with endosalpingosis and calcifications.      No evidence of malignancy.  - Right ovary      Cystadenofibroma with focal calcifications.      No borderline change or malignancy.  - Left ovary      Cystadenofibroma with focal calcifications.      No borderline change or  malignancy.  - Right Fallopian tube      Tubo-ovarian adhesions.      No endometriosis or malignancy.  - Left Fallopian tube      Tubo-ovarian adhesions and mild hydrosalpinx.      No endometriosis or malignancy.    Assessment & Plan:  6 week post op check following [place] with bilateral salpingectomy and oophorectomy .   Doing well  Reviewed her surg path.   Reviewed post op instructions and activities  Gradual increase to full activities  F/u in 3 months or sooner prn  Reviewed no intercourse for 2 more weeks  CBC today   All questions answered.   Noel Rodier L. Harraway-Smith, M.D., [facility]

## 2021-08-24 LAB — CBC
Hematocrit: 36.9 % (ref 34.0–46.6)
Hemoglobin: 12.1 g/dL (ref 11.1–15.9)
MCH: 25.2 pg — ABNORMAL LOW (ref 26.6–33.0)
MCHC: 32.8 g/dL (ref 31.5–35.7)
MCV: 77 fL — ABNORMAL LOW (ref 79–97)
Platelets: 418 10*3/uL (ref 150–450)
RBC: 4.81 x10E6/uL (ref 3.77–5.28)
RDW: 14.8 % (ref 11.7–15.4)
WBC: 5.9 10*3/uL (ref 3.4–10.8)

## 2021-11-24 ENCOUNTER — Ambulatory Visit: Payer: Medicaid Other | Admitting: Obstetrics & Gynecology

## 2021-12-02 LAB — COLOGUARD: COLOGUARD: NEGATIVE

## 2022-05-11 ENCOUNTER — Other Ambulatory Visit: Payer: Self-pay | Admitting: Family Medicine

## 2022-05-11 DIAGNOSIS — Z1231 Encounter for screening mammogram for malignant neoplasm of breast: Secondary | ICD-10-CM

## 2022-07-08 ENCOUNTER — Ambulatory Visit
Admission: RE | Admit: 2022-07-08 | Discharge: 2022-07-08 | Disposition: A | Discharge status: Home / Self Care | Payer: Medicaid Other | Source: Ambulatory Visit | Attending: Family Medicine | Admitting: Family Medicine

## 2022-07-08 DIAGNOSIS — Z1231 Encounter for screening mammogram for malignant neoplasm of breast: Secondary | ICD-10-CM

## 2022-08-31 ENCOUNTER — Ambulatory Visit: Payer: Medicaid Other | Admitting: Obstetrics & Gynecology

## 2022-08-31 ENCOUNTER — Encounter: Payer: Self-pay | Admitting: Obstetrics & Gynecology

## 2022-08-31 VITALS — BP 144/90 | HR 76 | Ht 64.5 in | Wt 224.0 lb

## 2022-08-31 DIAGNOSIS — Z01419 Encounter for gynecological examination (general) (routine) without abnormal findings: Secondary | ICD-10-CM | POA: Diagnosis not present

## 2022-08-31 DIAGNOSIS — I10 Essential (primary) hypertension: Secondary | ICD-10-CM | POA: Diagnosis not present

## 2022-08-31 MED ORDER — AMLODIPINE BESYLATE 5 MG PO TABS: 5.0000 mg | ORAL_TABLET | Freq: Every day | ORAL | 2 refills | Status: AC

## 2022-08-31 NOTE — Progress Notes (Signed)
Subjective:     Penny Yu is a 46 y.o. female here for a routine exam.  Current complaints: none. Pt has lost 50#s since GBP in Oct 2023.  She reports that she doesn't feel appreciably different. She was prev on BP meds but, her primary care provider took her off. She is not checking her BP at home regularly.      Gynecologic History Patient's last menstrual period was 06/29/2021. Contraception: none Last Pap: s/p hyst.  Last mammogram: 01/16/2022. Results were: normal  Obstetric History OB History  Gravida Para Term Preterm AB Living  1         1  SAB IAB Ectopic Multiple Live Births          1    # Outcome Date GA Lbr Len/2nd Weight Sex Delivery Anes PTL Lv  1 Gravida              The following portions of the patient's history were reviewed and updated as appropriate: allergies, current medications, past family history, past medical history, past social history, past surgical history, and problem list.  Review of Systems Pertinent items are noted in HPI.    Objective:  BP (!) 144/90   Pulse 76   Ht 5' 4.5" (1.638 m)   Wt 224 lb (101.6 kg)   LMP 06/29/2021 Comment: Pt stated she will be having a hysterectomy next Tuesday.  BMI 37.86 kg/m  General Appearance:    Alert, cooperative, no distress, appears stated age  Head:    Normocephalic, without obvious abnormality, atraumatic  Eyes:    conjunctiva/corneas clear, EOM's intact, both eyes  Ears:    Normal external ear canals, both ears  Nose:   Nares normal, septum midline, mucosa normal, no drainage    or sinus tenderness  Throat:   Lips, mucosa, and tongue normal; teeth and gums normal  Neck:   Supple, symmetrical, trachea midline, no adenopathy;    thyroid:  no enlargement/tenderness/nodules  Back:     Symmetric, no curvature, ROM normal, no CVA tenderness  Lungs:     respirations unlabored  Chest Wall:    No tenderness or deformity   Heart:    Regular rate and rhythm  Breast Exam:    No tenderness, masses, or nipple  abnormality  Abdomen:     Soft, non-tender, bowel sounds active all four quadrants,    no masses, no organomegaly  Genitalia:    Normal female without lesion, discharge or tenderness   Uterus and cervix surgically absent  Extremities:   Extremities normal, atraumatic, no cyanosis or edema  Pulses:   2+ and symmetric all extremities  Skin:   Skin color, texture, turgor normal, no rashes or lesions     Assessment:    Healthy female exam.  Essential HTN- off meds. Wants to restart meds but, at a lower dose.      Plan:  Penny Yu was seen today for annual exam.  Diagnoses and all orders for this visit:  Well female exam with routine gynecological exam  Essential hypertension -     amLODipine (NORVASC) 5 MG tablet; Take 1 tablet (5 mg total) by mouth daily.   F/u with primary care provider Reviewed importance of cardio AND strength training.  F/u in 1 year or sooner prn   Penny Yu L. Harraway-Smith, M.D., [facility]

## 2023-04-14 IMAGING — CT CT PELVIS W/ CM
2 of 3 series · 14 of 46 positions shown, 16 images · IV contrast (omnipaque)
Comparison: Pelvic sonogram 04/26/2021

CLINICAL DATA: Evaluate adnexal masses.

EXAM:
CT PELVIS WITH CONTRAST
TECHNIQUE: Multidetector CT imaging of the pelvis was performed using the
standard protocol following the bolus administration of intravenous
contrast.
CONTRAST:  75mL OMNIPAQUE IOHEXOL 300 MG/ML  SOLN

[Series 2: axial soft · axial · 0.86mm/px · z∈[-201,+11]mm · 11 of 122 slices shown, 13 images]
[im 8/122  soft-tissue]
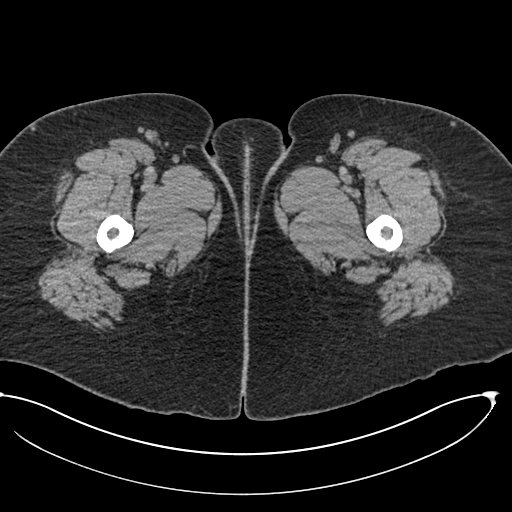
[im 8/122  bone]
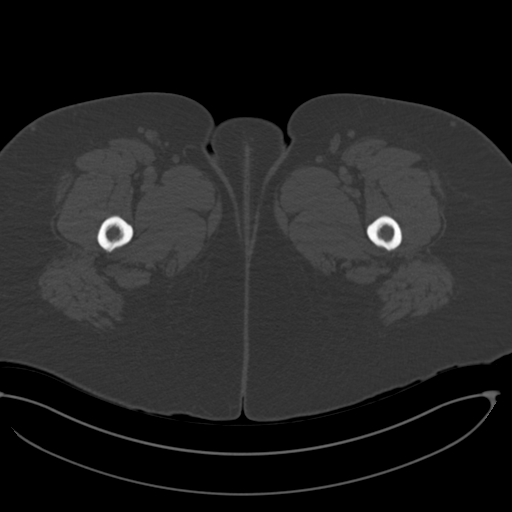
[im 20/122  soft-tissue]
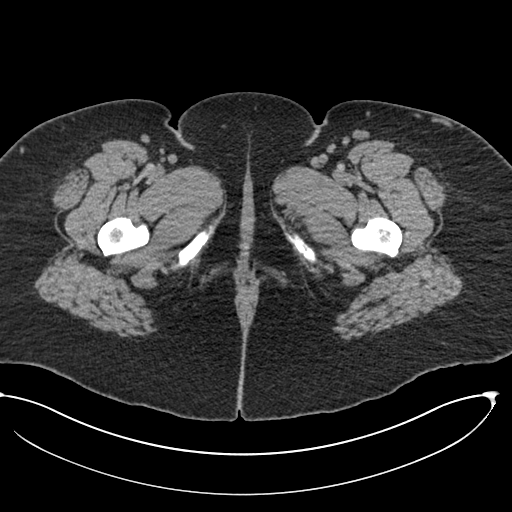
[im 28/122  soft-tissue]
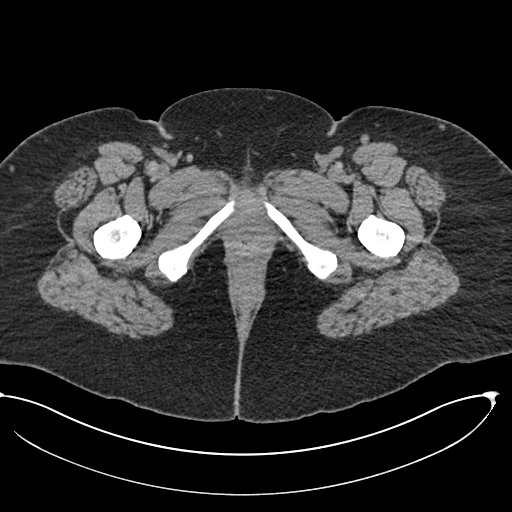
[im 40/122  soft-tissue]
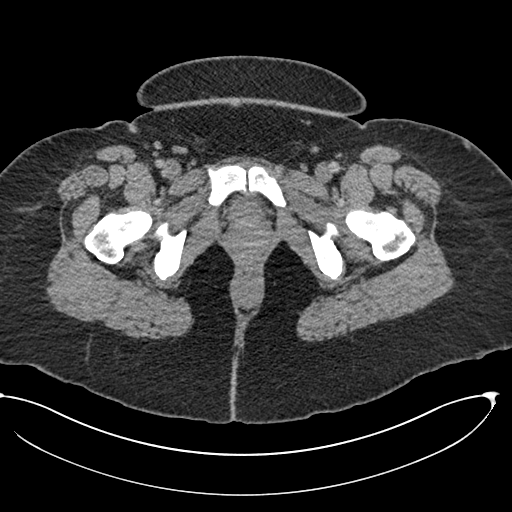
[im 51/122  soft-tissue]
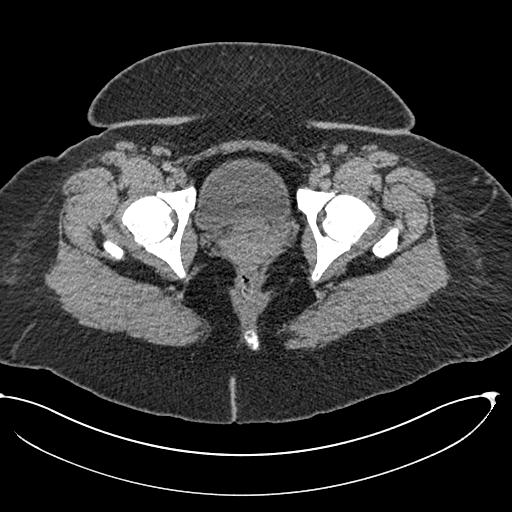
[im 63/122  soft-tissue]
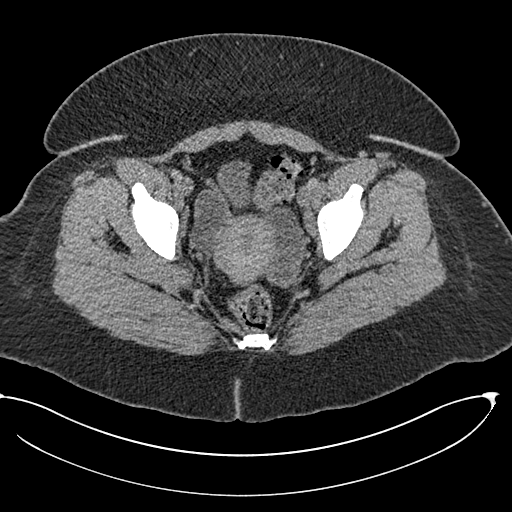
[im 71/122  soft-tissue]
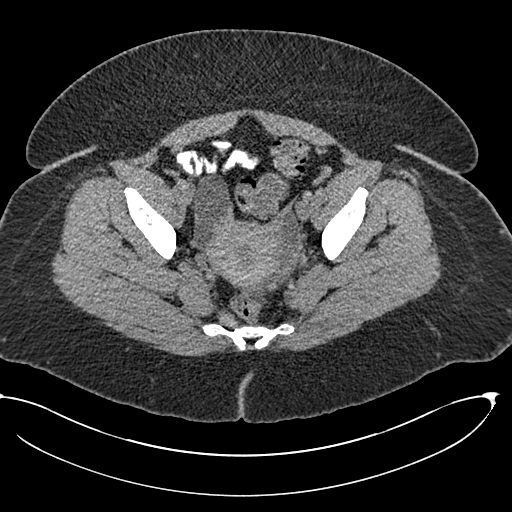
[im 82/122  soft-tissue]
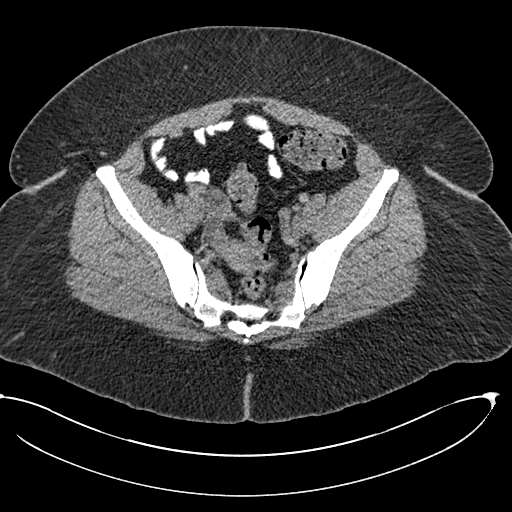
[im 94/122  soft-tissue]
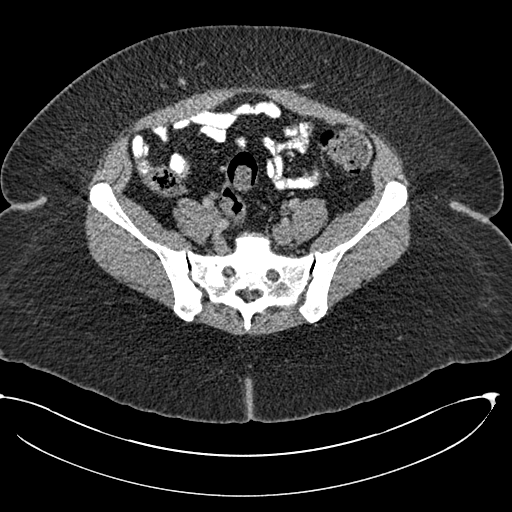
[im 94/122  bone]
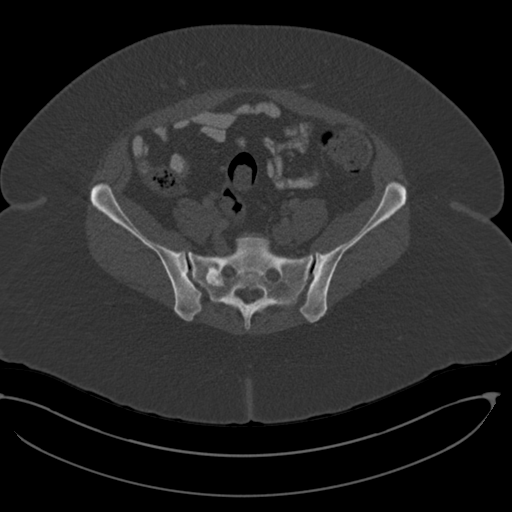
[im 102/122  soft-tissue]
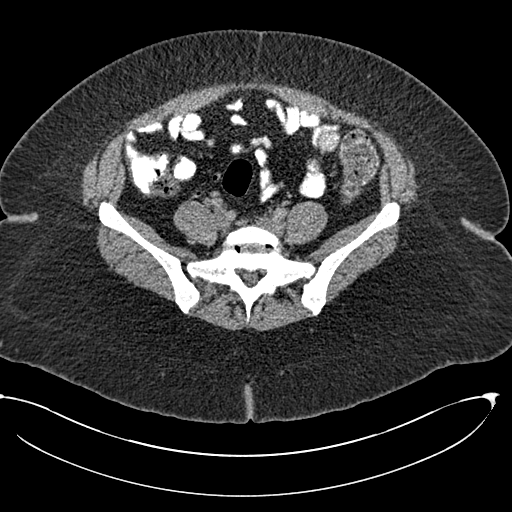
[im 114/122  soft-tissue]
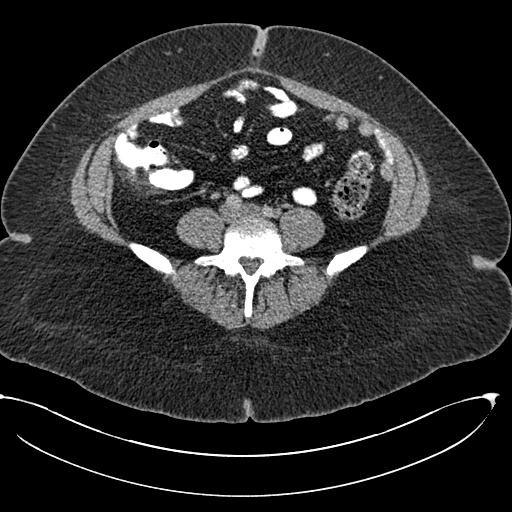

[Series 8: coronal st · coronal · 0.49mm/px · 3 of 173 slices shown]
[im 58/173  soft-tissue]
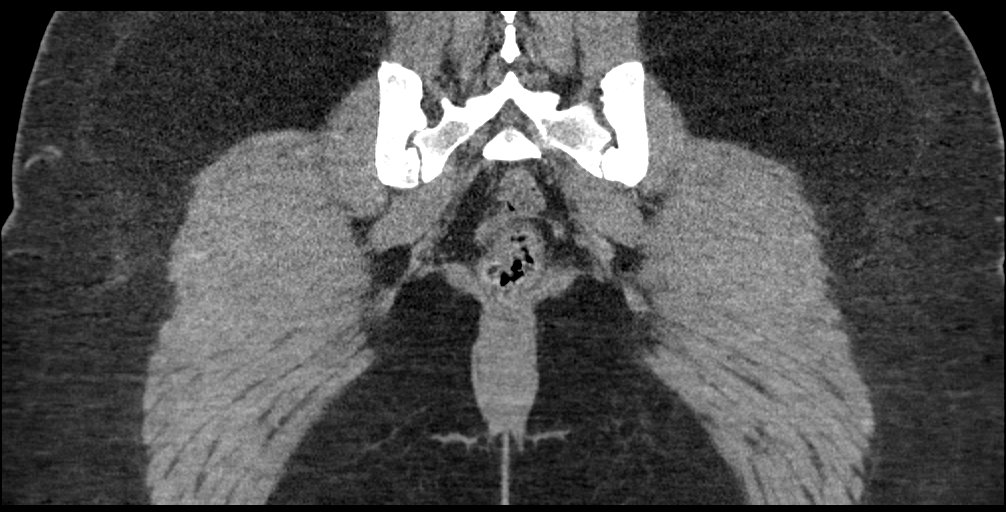
[im 77/173  soft-tissue]
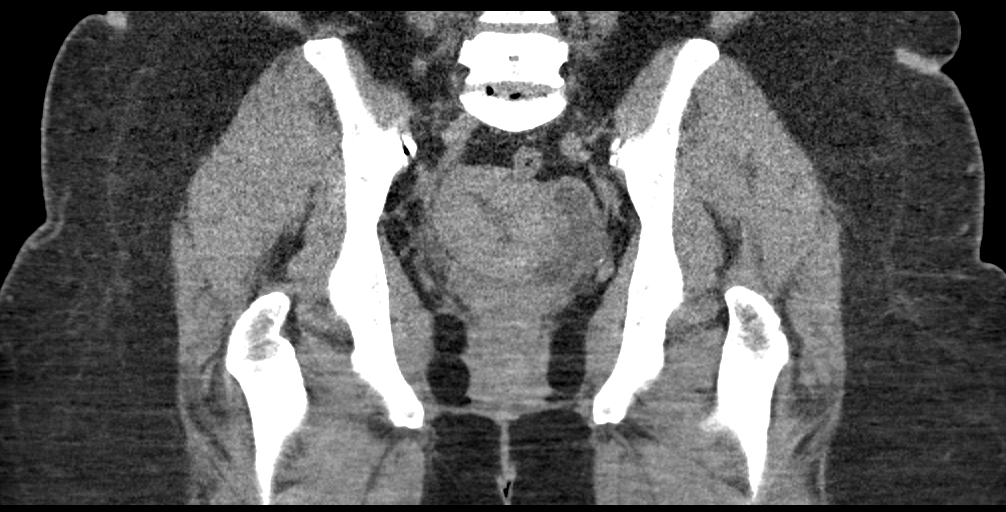
[im 96/173  soft-tissue]
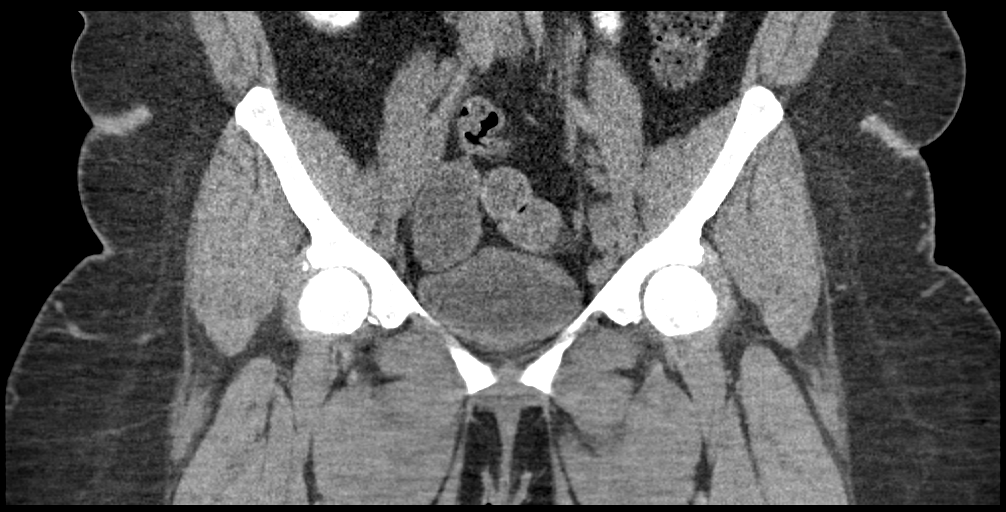

[14 of 46 positions shown; findings below may reference images not displayed]

FINDINGS: Urinary Tract:  Urinary bladder appears normal.

Bowel:  Unremarkable visualized pelvic bowel loops.

Vascular/Lymphatic: No pathologically enlarged lymph nodes.
Borderline enlarged left external iliac node measures 1 cm, image
65/2.

Reproductive:

Uterus: Measures 6.4 by 5.3 by 6.0 cm, image 55/2. Appears
retroflexed.

Endometrium: 1.4 cm.  No focal abnormality noted.

Right ovary: Measures 5.9 x 3.5 by 5.2 cm (volume = 56 cm^3). This
contains a large cystic structure measuring 5.7 cm which by
ultrasound contains internal areas of septation. Note: The internal
architecture of this structure better evaluated with sonogram or
contrast enhanced pelvic MRI.

Left ovary: Measures 6.7 x 3.0 by 5.2 cm (volume = 55 cm^3). The
normal left ovary appears replaced by large complex cystic structure
containing multiple areas of thickened and internal septation. Note:
The internal architecture of the structure is better evaluated with
ultrasound or contrast enhanced MRI.

Other:  No ascites or signs of peritoneal disease identified.

Musculoskeletal: No suspicious bone lesions identified.
IMPRESSION: 1. Bilateral ovarian enlargement as noted previously. Underlying
cystic lesions are noted as demonstrated on ultrasound from 04/26/21.
The cystic lesions are suboptimally evaluated by CT and would be
better evaluated with repeat pelvic sonogram or contrast enhanced
MRI. Underlying ovarian neoplasm is difficult to exclude based on CT
characteristics. If more definitive imaging is indicated then
contrast enhanced pelvic MRI is recommended.
2. Borderline enlarged left external iliac node measures 1 cm.

## 2023-06-16 ENCOUNTER — Other Ambulatory Visit: Payer: Self-pay | Admitting: Family Medicine

## 2023-06-16 DIAGNOSIS — Z0000 Encounter for general adult medical examination without abnormal findings: Secondary | ICD-10-CM

## 2023-08-11 ENCOUNTER — Ambulatory Visit: Payer: Medicaid Other

## 2023-09-14 ENCOUNTER — Ambulatory Visit
Admission: RE | Admit: 2023-09-14 | Discharge: 2023-09-14 | Disposition: A | Discharge status: Home / Self Care | Payer: Medicaid Other | Source: Ambulatory Visit | Attending: Family Medicine | Admitting: Family Medicine

## 2023-09-14 DIAGNOSIS — Z0000 Encounter for general adult medical examination without abnormal findings: Secondary | ICD-10-CM

## 2024-07-20 ENCOUNTER — Other Ambulatory Visit: Payer: Self-pay

## 2024-07-20 ENCOUNTER — Emergency Department (HOSPITAL_COMMUNITY)

## 2024-07-20 ENCOUNTER — Encounter (HOSPITAL_COMMUNITY): Payer: Self-pay | Admitting: Emergency Medicine

## 2024-07-20 ENCOUNTER — Emergency Department (HOSPITAL_COMMUNITY): Admission: EM | Admit: 2024-07-20 | Discharge: 2024-07-20 | Disposition: A | Discharge status: Home / Self Care

## 2024-07-20 DIAGNOSIS — Y92410 Unspecified street and highway as the place of occurrence of the external cause: Secondary | ICD-10-CM | POA: Insufficient documentation

## 2024-07-20 DIAGNOSIS — S39012A Strain of muscle, fascia and tendon of lower back, initial encounter: Secondary | ICD-10-CM | POA: Diagnosis not present

## 2024-07-20 DIAGNOSIS — S3992XA Unspecified injury of lower back, initial encounter: Secondary | ICD-10-CM | POA: Diagnosis present

## 2024-07-20 MED ORDER — METHOCARBAMOL 500 MG PO TABS: 500.0000 mg | ORAL_TABLET | Freq: Two times a day (BID) | ORAL | 0 refills | Status: AC

## 2024-07-20 MED ORDER — NAPROXEN 500 MG PO TABS: 500.0000 mg | ORAL_TABLET | Freq: Two times a day (BID) | ORAL | 0 refills | Status: AC

## 2024-07-20 NOTE — Discharge Instructions (Signed)
 Your x-ray did not show any acute traumatic findings.  There was some mild wear and tear.  Please take the naproxen  twice daily.  You may take the Tylenol  as well with this.  Take the methocarbamol as a muscle relaxer.  Do not drive or drink alcohol taking this as may make you drowsy.  Follow-up with your doctor for reevaluation and return to the ER for worsening symptoms.
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
## 2024-11-06 ENCOUNTER — Other Ambulatory Visit: Payer: Self-pay | Admitting: Family Medicine

## 2024-11-06 DIAGNOSIS — Z1231 Encounter for screening mammogram for malignant neoplasm of breast: Secondary | ICD-10-CM

## 2024-11-15 ENCOUNTER — Ambulatory Visit: Admission: RE | Admit: 2024-11-15 | Source: Ambulatory Visit

## 2024-11-15 DIAGNOSIS — Z1231 Encounter for screening mammogram for malignant neoplasm of breast: Secondary | ICD-10-CM
